# Patient Record
Sex: Male | Born: 1950
Health system: Southern US, Community
[De-identification: ages and names within clinical notes are randomized; demographics above are authoritative.]

## PROBLEM LIST (undated history)

## (undated) DIAGNOSIS — Z87442 Personal history of urinary calculi: Secondary | ICD-10-CM

## (undated) HISTORY — PX: OTHER SURGICAL HISTORY: SHX169

## (undated) HISTORY — PX: TONSILLECTOMY: SUR1361

---

## 1998-11-06 ENCOUNTER — Ambulatory Visit: Admission: RE | Admit: 1998-11-06 | Discharge: 1998-11-06 | Payer: Self-pay | Admitting: Internal Medicine

## 2007-01-11 ENCOUNTER — Encounter: Admission: RE | Admit: 2007-01-11 | Discharge: 2007-01-11 | Payer: Self-pay | Admitting: Geriatric Medicine

## 2010-10-07 ENCOUNTER — Emergency Department (HOSPITAL_COMMUNITY): Payer: BC Managed Care – PPO

## 2010-10-07 ENCOUNTER — Emergency Department (HOSPITAL_COMMUNITY)
Admission: EM | Admit: 2010-10-07 | Discharge: 2010-10-07 | Disposition: A | Discharge status: Home / Self Care | Payer: BC Managed Care – PPO | Attending: Emergency Medicine | Admitting: Emergency Medicine

## 2010-10-07 DIAGNOSIS — E7800 Pure hypercholesterolemia, unspecified: Secondary | ICD-10-CM | POA: Insufficient documentation

## 2010-10-07 DIAGNOSIS — G589 Mononeuropathy, unspecified: Secondary | ICD-10-CM | POA: Insufficient documentation

## 2010-10-07 DIAGNOSIS — IMO0002 Reserved for concepts with insufficient information to code with codable children: Secondary | ICD-10-CM | POA: Insufficient documentation

## 2010-10-07 DIAGNOSIS — N201 Calculus of ureter: Secondary | ICD-10-CM | POA: Insufficient documentation

## 2010-10-07 DIAGNOSIS — R109 Unspecified abdominal pain: Secondary | ICD-10-CM | POA: Insufficient documentation

## 2010-10-07 LAB — URINE MICROSCOPIC-ADD ON

## 2010-10-07 LAB — URINALYSIS, ROUTINE W REFLEX MICROSCOPIC
Bilirubin Urine: NEGATIVE
Nitrite: NEGATIVE
Specific Gravity, Urine: 1.018 (ref 1.005–1.030)
pH: 7 (ref 5.0–8.0)

## 2010-10-07 LAB — GLUCOSE, CAPILLARY: Glucose-Capillary: 132 mg/dL — ABNORMAL HIGH (ref 70–99)

## 2011-02-06 ENCOUNTER — Other Ambulatory Visit: Payer: Self-pay | Admitting: Internal Medicine

## 2011-02-06 NOTE — Progress Notes (Signed)
Failed attempt to print a script. Derrick Donaldson

## 2014-03-19 ENCOUNTER — Ambulatory Visit
Admission: RE | Admit: 2014-03-19 | Discharge: 2014-03-19 | Disposition: A | Discharge status: Home / Self Care | Payer: BC Managed Care – PPO | Source: Ambulatory Visit | Attending: Geriatric Medicine | Admitting: Geriatric Medicine

## 2014-03-19 ENCOUNTER — Other Ambulatory Visit: Payer: Self-pay | Admitting: Geriatric Medicine

## 2014-03-19 DIAGNOSIS — R059 Cough, unspecified: Secondary | ICD-10-CM

## 2014-03-19 DIAGNOSIS — R05 Cough: Secondary | ICD-10-CM

## 2014-07-17 ENCOUNTER — Other Ambulatory Visit: Payer: Self-pay | Admitting: Gastroenterology

## 2014-09-09 ENCOUNTER — Other Ambulatory Visit: Payer: Self-pay | Admitting: Gastroenterology

## 2014-09-22 ENCOUNTER — Encounter (HOSPITAL_COMMUNITY): Payer: Self-pay | Admitting: *Deleted

## 2014-09-23 ENCOUNTER — Ambulatory Visit (HOSPITAL_COMMUNITY): Payer: BC Managed Care – PPO | Admitting: Anesthesiology

## 2014-09-23 ENCOUNTER — Encounter (HOSPITAL_COMMUNITY)
Admission: RE | Disposition: A | Discharge status: Home / Self Care | Payer: Self-pay | Source: Ambulatory Visit | Attending: Gastroenterology

## 2014-09-23 ENCOUNTER — Encounter (HOSPITAL_COMMUNITY): Payer: Self-pay

## 2014-09-23 ENCOUNTER — Ambulatory Visit (HOSPITAL_COMMUNITY)
Admission: RE | Admit: 2014-09-23 | Discharge: 2014-09-23 | Disposition: A | Discharge status: Home / Self Care | Payer: BC Managed Care – PPO | Source: Ambulatory Visit | Attending: Gastroenterology | Admitting: Gastroenterology

## 2014-09-23 DIAGNOSIS — Z87442 Personal history of urinary calculi: Secondary | ICD-10-CM | POA: Insufficient documentation

## 2014-09-23 DIAGNOSIS — Z1211 Encounter for screening for malignant neoplasm of colon: Secondary | ICD-10-CM | POA: Insufficient documentation

## 2014-09-23 DIAGNOSIS — G629 Polyneuropathy, unspecified: Secondary | ICD-10-CM | POA: Diagnosis not present

## 2014-09-23 DIAGNOSIS — G2581 Restless legs syndrome: Secondary | ICD-10-CM | POA: Insufficient documentation

## 2014-09-23 DIAGNOSIS — E780 Pure hypercholesterolemia: Secondary | ICD-10-CM | POA: Diagnosis not present

## 2014-09-23 DIAGNOSIS — D124 Benign neoplasm of descending colon: Secondary | ICD-10-CM | POA: Insufficient documentation

## 2014-09-23 HISTORY — DX: Personal history of urinary calculi: Z87.442

## 2014-09-23 HISTORY — PX: COLONOSCOPY WITH PROPOFOL: SHX5780

## 2014-09-23 SURGERY — COLONOSCOPY WITH PROPOFOL
Anesthesia: Monitor Anesthesia Care

## 2014-09-23 MED ORDER — PROPOFOL 10 MG/ML IV BOLUS
INTRAVENOUS | Status: AC
  Filled 2014-09-23: qty 20

## 2014-09-23 MED ORDER — PROPOFOL 10 MG/ML IV BOLUS
INTRAVENOUS | Status: DC | PRN
  Administered 2014-09-23 (×2): 75 mg via INTRAVENOUS
  Administered 2014-09-23: 50 mg via INTRAVENOUS
  Administered 2014-09-23: 25 mg via INTRAVENOUS
  Administered 2014-09-23: 50 mg via INTRAVENOUS

## 2014-09-23 MED ORDER — SODIUM CHLORIDE 0.9 % IV SOLN: INTRAVENOUS | Status: DC

## 2014-09-23 MED ORDER — LACTATED RINGERS IV SOLN
INTRAVENOUS | Status: DC | PRN
  Administered 2014-09-23: 11:00:00 via INTRAVENOUS

## 2014-09-23 SURGICAL SUPPLY — 21 items

## 2014-09-23 NOTE — H&P (Signed)
  Procedure: Baseline screening colonoscopy  History: The patient is a 64 year old male born [birth year]. He is scheduled to undergo his first screening colonoscopy with polypectomy to prevent colon cancer.  Medication allergies: Lipitor and Crestor caused muscle pain. Pravastatin caused memory loss  Past medical history: Left arthroscopic knee surgery. Kidney stone. Peripheral neuropathy. Lumbar disc disease. Hypercholesterolemia. Nasal polyps. Restless leg syndrome.  Exam: The patient is alert and lying comfortably on the endoscopy stretcher. Abdomen is soft and nontender to palpation. Cardiac exam reveals a regular rhythm. Lungs are clear to auscultation.  Plan: Proceed with baseline screening colonoscopy

## 2014-09-23 NOTE — Op Note (Signed)
Procedure: Baseline screening colonoscopy  Endoscopist: Earle Gell  Premedication: Propofol administered by anesthesia  Procedure: The patient was placed in left lateral decubitus position. Anal inspection and digital rectal exam were normal. The Pentax pediatric colonoscope was introduced into the rectum and advanced to the cecum. A normal-appearing appendiceal orifice was identified. A normal-appearing ileocecal valve was identified. Colonic preparation for the exam today was excellent. Withdrawal time was 11 minutes  Rectum. Normal. Retroflexed view of the distal rectum normal  Sigmoid colon. Normal  Descending colon. From the proximal descending colon, a 2 mm sessile polyp was removed with the cold biopsy forceps  Splenic flexure. Normal  Transverse colon. Normal  Hepatic flexure. Normal  Ascending colon. Normal  Cecum and ileocecal valve. Normal  Assessment: A diminutive polyp was removed from the descending colon, otherwise normal colonoscopy  Recommendation: If the diminutive descending colon polyp returns adenomatous pathologically, the patient should undergo a surveillance colonoscopy in 5 years. If the polyp returns nonneoplastic pathologically, he should undergo a repeat screening colonoscopy in 10 years

## 2014-09-23 NOTE — Transfer of Care (Signed)
Immediate Anesthesia Transfer of Care Note  Patient: Derrick Donaldson  Procedure(s) Performed: Procedure(s): COLONOSCOPY WITH PROPOFOL (N/A)  Patient Location: PACU  Anesthesia Type:MAC  Level of Consciousness: awake, sedated and patient cooperative  Airway & Oxygen Therapy: Patient Spontanous Breathing and Patient connected to face mask oxygen  Post-op Assessment: Report given to RN and Post -op Vital signs reviewed and stable  Post vital signs: Reviewed and stable  Last Vitals:  Filed Vitals:   09/23/14 1036  BP: 133/92  Temp: 36.4 C  Resp: 19    Complications: No apparent anesthesia complications

## 2014-09-23 NOTE — Anesthesia Preprocedure Evaluation (Signed)
Anesthesia Evaluation  Patient identified by MRN, date of birth, ID band Patient awake    History of Anesthesia Complications Negative for: history of anesthetic complications  Airway Mallampati: I   Neck ROM: Full    Dental  (+) Teeth Intact   Pulmonary neg pulmonary ROS,  breath sounds clear to auscultation        Cardiovascular negative cardio ROS  Rhythm:Regular Rate:Normal     Neuro/Psych negative neurological ROS     GI/Hepatic negative GI ROS, Neg liver ROS,   Endo/Other  negative endocrine ROS  Renal/GU negative Renal ROS     Musculoskeletal   Abdominal   Peds  Hematology negative hematology ROS (+)   Anesthesia Other Findings   Reproductive/Obstetrics                             Anesthesia Physical Anesthesia Plan  ASA: I  Anesthesia Plan: MAC   Post-op Pain Management:    Induction:   Airway Management Planned: Natural Airway  Additional Equipment:   Intra-op Plan:   Post-operative Plan:   Informed Consent: I have reviewed the patients History and Physical, chart, labs and discussed the procedure including the risks, benefits and alternatives for the proposed anesthesia with the patient or authorized representative who has indicated his/her understanding and acceptance.   Dental advisory given  Plan Discussed with: Surgeon  Anesthesia Plan Comments:         Anesthesia Quick Evaluation

## 2014-09-23 NOTE — Discharge Instructions (Signed)

## 2014-09-24 ENCOUNTER — Encounter (HOSPITAL_COMMUNITY): Payer: Self-pay | Admitting: Gastroenterology

## 2014-09-24 NOTE — Anesthesia Postprocedure Evaluation (Signed)
  Anesthesia Post-op Note  Patient: Derrick Donaldson  Procedure(s) Performed: Procedure(s): COLONOSCOPY WITH PROPOFOL (N/A)  Patient Location: Endoscopy Unit  Anesthesia Type:MAC  Level of Consciousness: awake and alert   Airway and Oxygen Therapy: Patient Spontanous Breathing  Post-op Pain: none  Post-op Assessment: Post-op Vital signs reviewed  Post-op Vital Signs: stable  Last Vitals:  Filed Vitals:   09/23/14 1200  BP: 135/83  Pulse: 61  Temp:   Resp: 22    Complications: No apparent anesthesia complications

## 2016-02-07 IMAGING — CR DG CHEST 2V
2 series · 2 of 2 positions shown · non-contrast
Comparison: CT Abdomen and Pelvis 10/07/2010.

CLINICAL DATA: 63-year-old male with persistent nonproductive cough
for 5 weeks. Initial encounter.

EXAM:
CHEST  2 VIEW

[w chest pa]
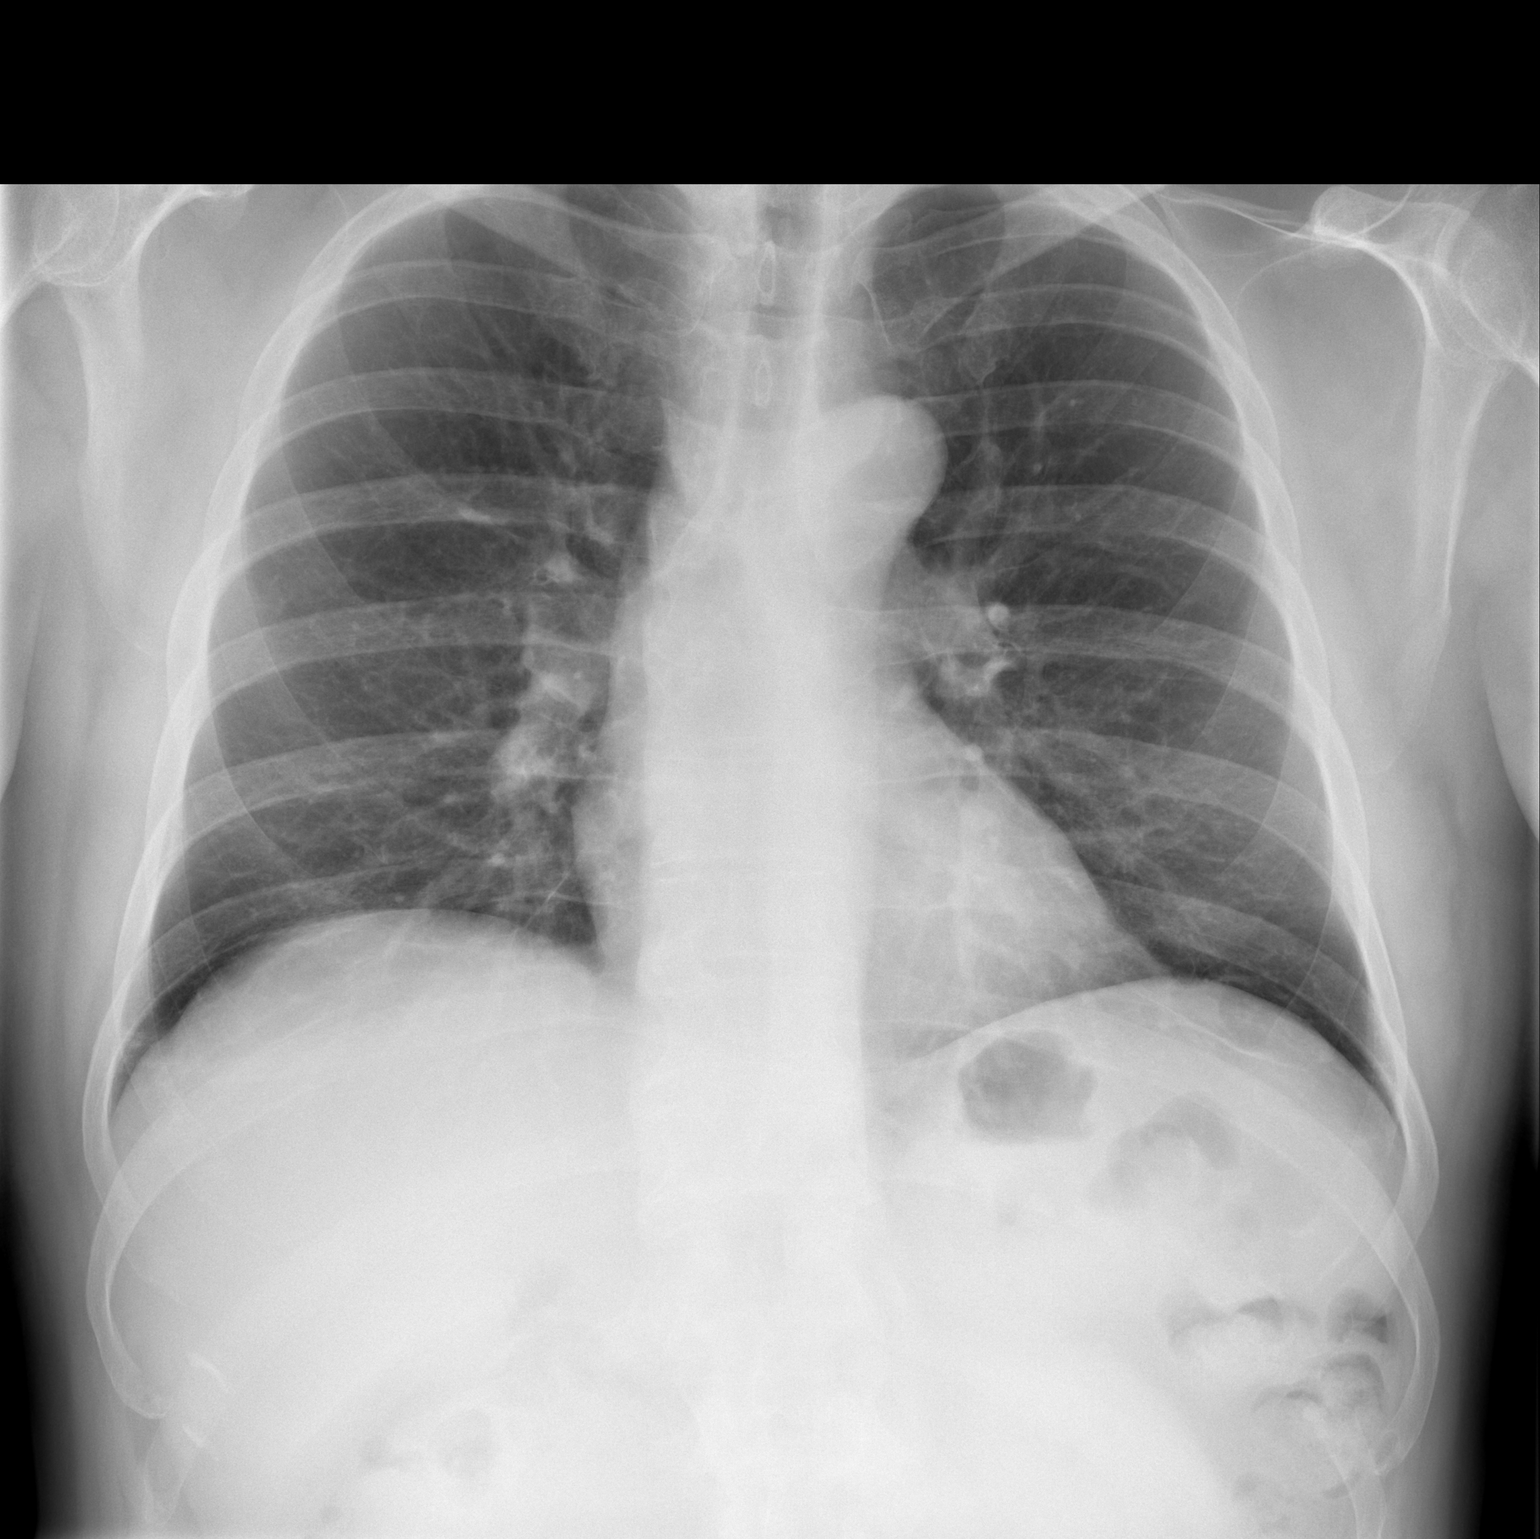

[w chest lat]
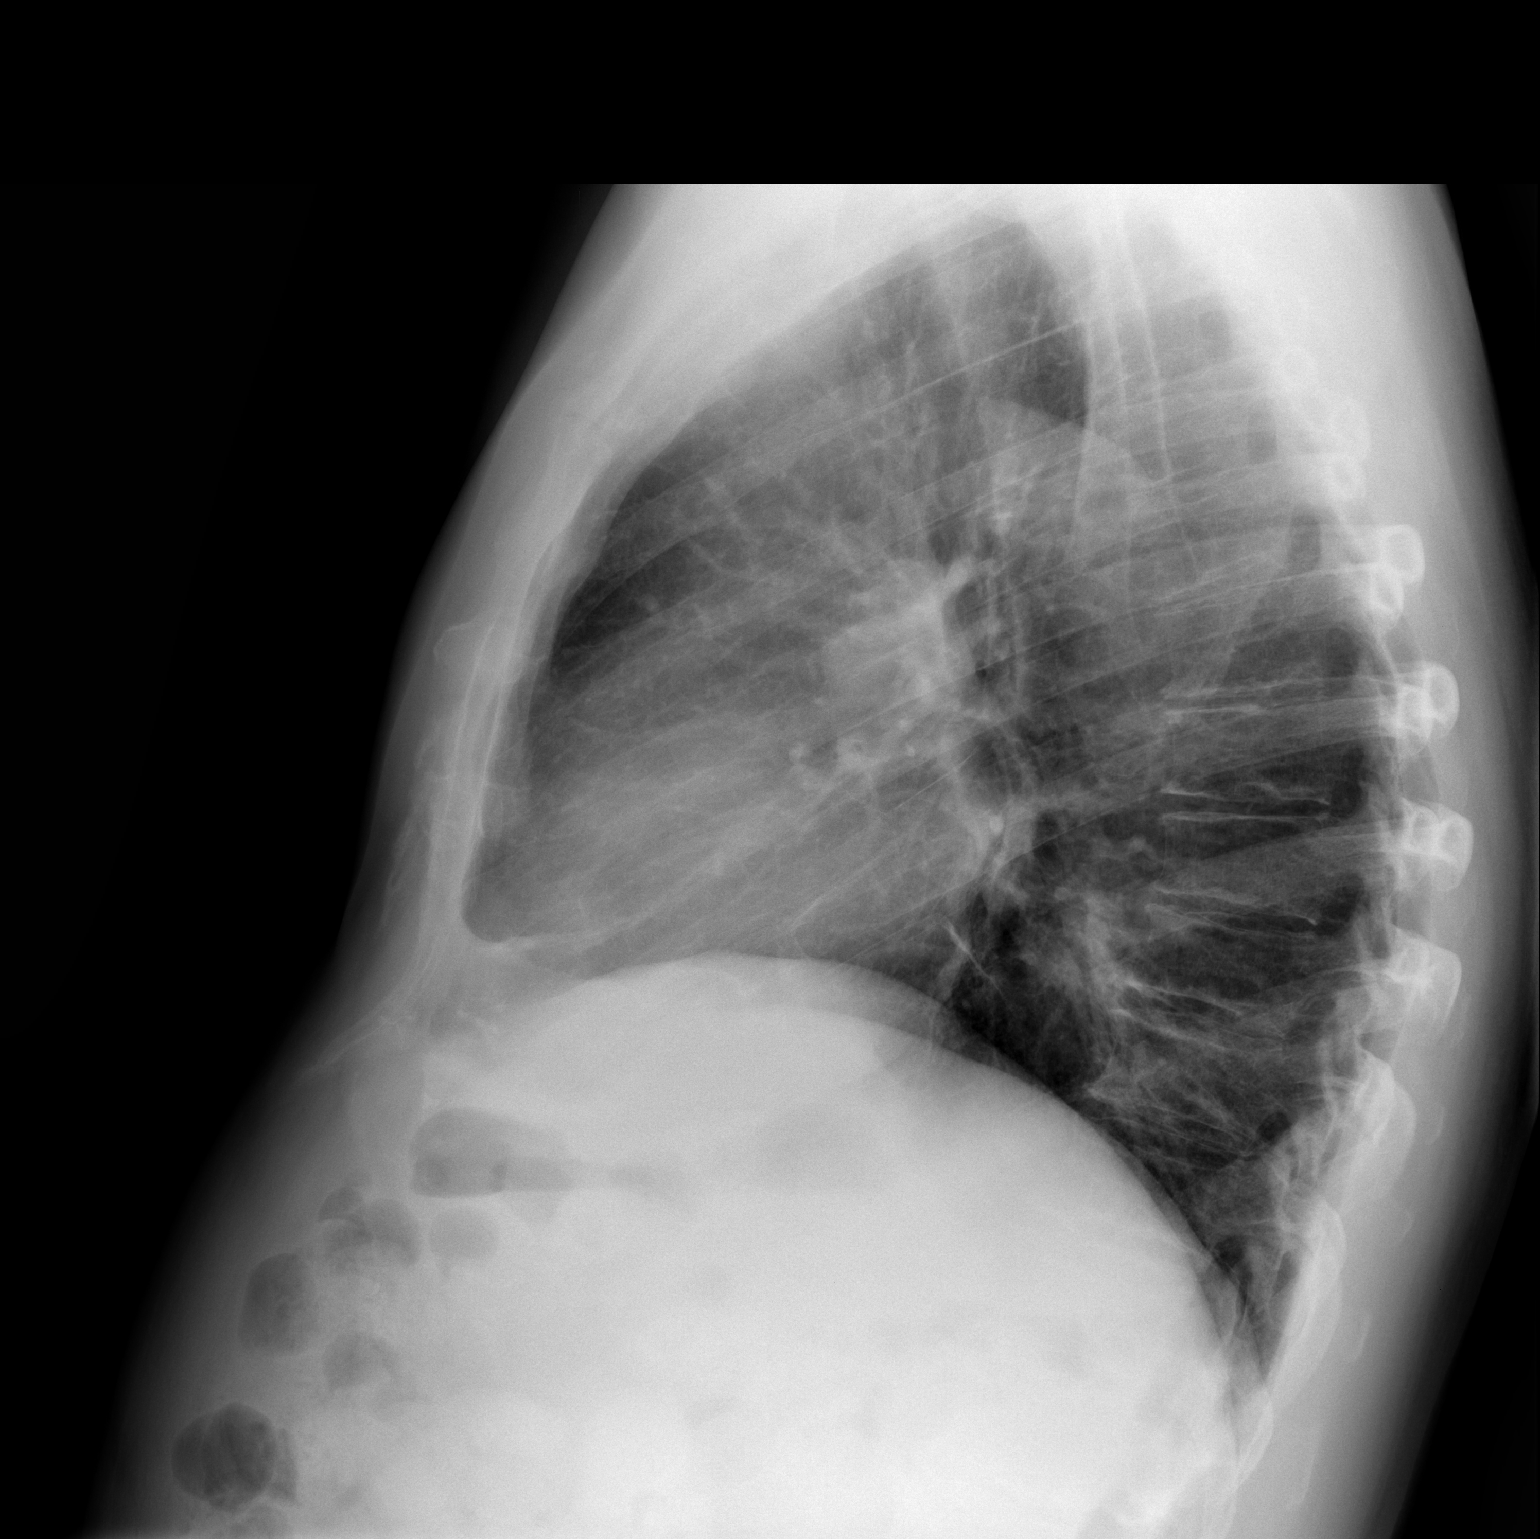

[2 of 2 positions shown; findings below may reference images not displayed]

FINDINGS: Normal cardiac size and mediastinal contours. Visualized tracheal
air column is within normal limits. Lung volumes are within normal
limits. No pneumothorax or pulmonary edema. No pleural effusion or
consolidation. No confluent pulmonary opacity. Mild scoliosis and
multilevel degenerative changes in the spine. No acute osseous
abnormality identified.
IMPRESSION: No acute cardiopulmonary abnormality.

## 2017-03-31 DIAGNOSIS — Z79899 Other long term (current) drug therapy: Secondary | ICD-10-CM | POA: Diagnosis not present

## 2017-03-31 DIAGNOSIS — H905 Unspecified sensorineural hearing loss: Secondary | ICD-10-CM | POA: Diagnosis not present

## 2017-03-31 DIAGNOSIS — G629 Polyneuropathy, unspecified: Secondary | ICD-10-CM | POA: Diagnosis not present

## 2017-03-31 DIAGNOSIS — E7800 Pure hypercholesterolemia, unspecified: Secondary | ICD-10-CM | POA: Diagnosis not present

## 2017-03-31 DIAGNOSIS — Z6829 Body mass index (BMI) 29.0-29.9, adult: Secondary | ICD-10-CM | POA: Diagnosis not present

## 2017-03-31 DIAGNOSIS — E663 Overweight: Secondary | ICD-10-CM | POA: Diagnosis not present

## 2017-03-31 DIAGNOSIS — Z23 Encounter for immunization: Secondary | ICD-10-CM | POA: Diagnosis not present

## 2017-03-31 DIAGNOSIS — Z0000 Encounter for general adult medical examination without abnormal findings: Secondary | ICD-10-CM | POA: Diagnosis not present

## 2017-03-31 DIAGNOSIS — R269 Unspecified abnormalities of gait and mobility: Secondary | ICD-10-CM | POA: Diagnosis not present

## 2017-04-04 DIAGNOSIS — E785 Hyperlipidemia, unspecified: Secondary | ICD-10-CM | POA: Diagnosis not present

## 2017-04-04 DIAGNOSIS — R972 Elevated prostate specific antigen [PSA]: Secondary | ICD-10-CM | POA: Diagnosis not present

## 2017-04-04 DIAGNOSIS — Z79899 Other long term (current) drug therapy: Secondary | ICD-10-CM | POA: Diagnosis not present

## 2017-04-04 DIAGNOSIS — Z0000 Encounter for general adult medical examination without abnormal findings: Secondary | ICD-10-CM | POA: Diagnosis not present

## 2017-04-11 ENCOUNTER — Ambulatory Visit: Payer: Medicare Other | Attending: Geriatric Medicine | Admitting: Physical Therapy

## 2017-04-11 DIAGNOSIS — R208 Other disturbances of skin sensation: Secondary | ICD-10-CM | POA: Diagnosis not present

## 2017-04-11 DIAGNOSIS — R2689 Other abnormalities of gait and mobility: Secondary | ICD-10-CM | POA: Diagnosis not present

## 2017-04-11 DIAGNOSIS — M6281 Muscle weakness (generalized): Secondary | ICD-10-CM

## 2017-04-11 DIAGNOSIS — R29818 Other symptoms and signs involving the nervous system: Secondary | ICD-10-CM | POA: Diagnosis not present

## 2017-04-12 ENCOUNTER — Encounter: Payer: Self-pay | Admitting: Physical Therapy

## 2017-04-12 NOTE — Therapy (Addendum)
[facility] [address] [place], [place], [address] Phone: [phone]   Fax:  [phone]  Physical Therapy Evaluation  Patient Details  Name: Derrick Donaldson MRN: [id number] Date of Birth: May 12, 1951 Referring Provider: Lajean Manes MD  Encounter Date: 04/11/2017      PT End of Session - 04/12/17 2238    Visit Number 1   Number of Visits 17   Date for PT Re-Evaluation 05/11/17   Authorization Type BCBS   PT Start Time 1102   PT Stop Time 1145   PT Time Calculation (min) 43 min   Activity Tolerance Patient tolerated treatment well   Behavior During Therapy [facility] for tasks assessed/performed      Past Medical History:  Diagnosis Date  . History of kidney stones     Past Surgical History:  Procedure Laterality Date  . COLONOSCOPY WITH PROPOFOL N/A 09/23/2014   Procedure: COLONOSCOPY WITH PROPOFOL;  Surgeon: [name], MD;  Location: [facility];  Service: Endoscopy;  Laterality: N/A;  . knee arthroscopic surgery    . TONSILLECTOMY      There were no vitals filed for this visit.       Subjective Assessment - 04/13/17 1226    Subjective I had an incident a few weeks ago (tripped going up steps while passing back quizzes). No injury. Did also fall in the parking lot and tripped and fell for no particular reason. Has had significant stumbles also.    Pertinent History Peripheral neuropathy. Lumbar disc disease. Restless leg syndrome, hearing loss R ear   Limitations Standing;Walking   How long can you stand comfortably? 15 minutes   How long can you walk comfortably? 30 minutes   Patient Stated Goals better balance; easier getting out of the chair; less falls and stumbling; better balance when riding bike (and stopping with clipss)          04/12/17 0001  Assessment  Medical Diagnosis gait disorder  Referring Provider Lajean Manes MD  Onset Date/Surgical Date (balance slowly over past  couple years)  Prior Therapy none  Precautions  Precautions Fall  Restrictions  Weight Bearing Restrictions No  Balance Screen  Has the patient fallen in the past 6 months Yes  How many times? 2  Has the patient had a decrease in activity level because of a fear of falling?  Yes  Is the patient reluctant to leave their home because of a fear of falling?  No  Home Environment  Living Environment Private residence  Living Arrangements Spouse/significant other;Children (37 yo)  Type of [place] to enter  Entrance Stairs-Number of Steps 2  Entrance Stairs-Rails None (in front has 1 rail and more steps)  Home Layout Two level;Able to live on main level with bedroom/bathroom;Full bath on main level  Alternate Level Stairs-Number of Steps 14  Alternate Level Stairs-Rails Right  Home Equipment None  Prior Function  Level of Independence Independent  Vocation Part time employment (phased retirement)  Biomedical scientist professor, [employer]olic[employer]  Leisure cycling;   Cognition  Overall Cognitive Status Within Functional Limits for tasks assessed  Observation/Other Assessments  Focus on Therapeutic Outcomes (FOTO)  FS 69 risk adjusted 60  Activities of Balance Confidence Scale (ABC Scale)  87.5%  Sensation  Light Touch Impaired Detail (feet)  Proprioception Appears Intact  Coordination  Gross Motor Movements are Fluid and Coordinated Yes  Fine Motor Movements are Fluid and Coordinated Yes  ROM / Strength  AROM / PROM / Strength AROM;Strength  AROM  Overall AROM  Within functional limits for tasks performed  Strength  Overall Strength Within functional limits for tasks performed  Transfers  Transfers Sit to Stand;Stand to Sit  Sit to Stand 6: Modified independent (Device/Increase time);With upper extremity assist  Stand to Sit 6: Modified independent (Device/Increase time);With upper extremity assist  Ambulation/Gait  Ambulation/Gait Yes   Ambulation/Gait Assistance 6: Modified independent (Device/Increase time)  Ambulation Distance (Feet) 100 Feet (x2, 60)  Assistive device None  Gait Pattern Step-through pattern;Decreased arm swing - right;Decreased arm swing - left;Lateral hip instability  Ambulation Surface Level  Gait velocity 32.8/9.0=3.64 ft/sec normal  (32.8/7.04=4.66 ft/sec)  Standardized Balance Assessment  Standardized Balance Assessment Berg Balance Test  Berg Balance Test  Sit to Stand 3  Standing Unsupported 4  Sitting with Back Unsupported but Feet Supported on Floor or Stool 4  Stand to Sit 3  Transfers 3  Standing Unsupported with Eyes Closed 4  Standing Ubsupported with Feet Together 3  From Standing, Reach Forward with Outstretched Arm 4  From Standing Position, Pick up Object from Floor 4  From Standing Position, Turn to Look Behind Over each Shoulder 2  Turn 360 Degrees 4  Standing Unsupported, Alternately Place Feet on Step/Stool 0  Standing Unsupported, One Foot in Front 0  Standing on One Leg 1  Total Score 39  Functional Gait  Assessment  Gait assessed  Yes  Gait Level Surface 2  Change in Gait Speed 3  Gait with Horizontal Head Turns 2  Gait with Vertical Head Turns 2  Gait and Pivot Turn 3  Step Over Obstacle 3  Gait with Narrow Base of Support 0  Gait with Eyes Closed 2  Ambulating Backwards 3  Steps 2  Total Score 22           Objective measurements completed on examination: See above findings.                  PT Education - 04/12/17 2236    Education provided Yes   Education Details results of eval and PT POC; 3 parts of balance system and plan to do SOT next visit; strategies to increase safety due to neuropathy (avoid walking in dark, using hand on surface to improve somatosensory input)   Person(s) Educated Patient   Methods Explanation   Comprehension Verbalized understanding          PT Short Term Goals - 04/13/17 1056      PT SHORT TERM  GOAL #1   Title Patient will be independent with basic HEP to address balance deficits. (Target date all STGs: 05/11/17)   Time 4   Period Weeks   Status New   Target Date 05/11/17     PT SHORT TERM GOAL #2   Title Patient will improve FGA score to >24/30 to indicate lesser fall risk and improved balance..   Baseline 22/30   Time 4   Period Weeks   Status New     PT SHORT TERM GOAL #3   Title Patient will improve Berg Balance score to >=45/56 to indicate lesser fall risk and improved balance.   Baseline 39/56   Time 4   Period Weeks   Status New     PT SHORT TERM GOAL #4   Title Patient will verbalize understanding of fall prevention strategies for his home environment.    Time 1   Period Weeks   Status New  PT SHORT TERM GOAL #5   Title Complete SOT via Balancemaster and set STG v LTG as appropriate.    Time 1   Period Weeks   Status New           PT Long Term Goals - 04/13/17 1102      PT LONG TERM GOAL #1   Title Patient will be independent with updated HEP for balance (Target all LTGs 06/10/17)   Time 8   Period Weeks   Status New   Target Date 06/10/17     PT LONG TERM GOAL #2   Title Patient will improve FGA score to >=27/30 to indicate lesser fall risk and improved balance.    Time 8   Period Weeks   Status New     PT LONG TERM GOAL #3   Title Patient will improve Berg score to >= 50/56 to indicate lesser fall risk and improved balance.   Time 8   Period Weeks   Status New     PT LONG TERM GOAL #4   Title Patient will verbalize a plan for continued community based activity upon discharge from PT   Time [place] - 04/12/17 2242    Clinical Impression Statement Patient presents for PT evaluation s/p 2 falls in 6 months. Standardized tests indicate he is a higher fall risk Merrilee Jansky [phone] with <45 indicative of higher risk; FGA 22/30 with <=22 indicative). Patient can benefit from PT to educate him  on strategies to compensate for sensory loss, improve his strategies to help him maintain his balance, and strategies to help him recover his balance when he does experience imbalance. Patient will benefit from the PT interventions listed below to address the additional deficits outlined below.    History and Personal Factors relevant to plan of care: PMH-Peripheral neuropathy. Lumbar disc disease. Restless leg syndrome, hearing loss R ear;  Personal factors-mechanism of injury (sensory loss/neuropathy)   Clinical Presentation Evolving   Clinical Presentation due to: progressive nature of neuropathy and declining balance with 2 falls already   Clinical Decision Making Moderate   Rehab Potential Good   Clinical Impairments Affecting Rehab Potential neuropathy bil LEs   PT Frequency 2x / week   PT Duration 8 weeks   PT Treatment/Interventions ADLs/Self Care Home Management;Functional mobility training;Stair training;Gait training;DME Instruction;Therapeutic activities;Therapeutic exercise;Balance training;Neuromuscular re-education;Patient/family education;Vestibular;Visual/perceptual remediation/compensation   PT Next Visit Plan do SOT and begin HEP   Consulted and Agree with Plan of Care Patient      Patient will benefit from skilled therapeutic intervention in order to improve the following deficits and impairments:  Decreased balance, Decreased knowledge of use of DME, Difficulty walking, Impaired perceived functional ability, Impaired sensation, Postural dysfunction  Visit Diagnosis: Other disturbances of skin sensation - Plan: PT plan of care cert/re-cert  Other abnormalities of gait and mobility - Plan: PT plan of care cert/re-cert  Muscle weakness (generalized) - Plan: PT plan of care cert/re-cert  Other symptoms and signs involving the nervous system - Plan: PT plan of care cert/re-cert     Problem List There are no active problems to display for this patient.     Rexanne Mano, PT 04/13/2017, 12:26 PM  [facility] [address] [place], [place], [address] Phone: [phone]   Fax:  [phone]  Name: JOSEMIGUEL GRIES  MRN: [id number] Date of Birth: Dec 05, 1950

## 2017-04-13 ENCOUNTER — Ambulatory Visit: Payer: Medicare Other | Admitting: Physical Therapy

## 2017-04-13 ENCOUNTER — Encounter: Payer: Self-pay | Admitting: Physical Therapy

## 2017-04-13 DIAGNOSIS — R2689 Other abnormalities of gait and mobility: Secondary | ICD-10-CM | POA: Diagnosis not present

## 2017-04-13 DIAGNOSIS — M6281 Muscle weakness (generalized): Secondary | ICD-10-CM | POA: Diagnosis not present

## 2017-04-13 DIAGNOSIS — R29818 Other symptoms and signs involving the nervous system: Secondary | ICD-10-CM | POA: Diagnosis not present

## 2017-04-13 DIAGNOSIS — R208 Other disturbances of skin sensation: Secondary | ICD-10-CM

## 2017-04-13 NOTE — Addendum Note (Signed)
Addended by: Rexanne Mano on: 04/13/2017 12:30 PM   Modules accepted: Orders

## 2017-04-13 NOTE — Patient Instructions (Signed)
     Be sure to do exercises in a corner with a chair back in front of you for safety!!     Feet Apart, Head Motion - Eyes Open    With eyes open, feet apart, move head slowly: UP to the left and DOWN to the right (diagonally) x 10 reps  Do 1 sessions per day.  Copyright  VHI. All rights reserved.    Feet Together, Head Motion - Eyes Open    With eyes open, feet together, move head slowly: up and down x 10, the left and right x 10   Do 1 sessions per day.  Copyright  VHI. All rights reserved.     Feet Apart, Head Motion - Eyes Closed   With eyes closed and feet shoulder width apart, hold for 30 seconds. If you don't make 30 seconds try a total of 3 times.  Do 1 sessions per day.

## 2017-04-13 NOTE — Therapy (Signed)
[facility] [address] [place], [place], [address] Phone: [phone]   Fax:  [phone]  Physical Therapy Treatment  Patient Details  Name: Derrick Donaldson MRN: [id number] Date of Birth: 11/04/1950 Referring Provider: Lajean Manes MD  Encounter Date: 04/13/2017      PT End of Session - 04/13/17 1959    Visit Number 2   Number of Visits 17   Date for PT Re-Evaluation 05/11/17   Authorization Type BCBS   PT Start Time 0845   PT Stop Time 0930   PT Time Calculation (min) 45 min   Activity Tolerance Patient tolerated treatment well   Behavior During Therapy [facility] for tasks assessed/performed      Past Medical History:  Diagnosis Date  . History of kidney stones     Past Surgical History:  Procedure Laterality Date  . COLONOSCOPY WITH PROPOFOL N/A 09/23/2014   Procedure: COLONOSCOPY WITH PROPOFOL;  Surgeon: [name], MD;  Location: [facility];  Service: Endoscopy;  Laterality: N/A;  . knee arthroscopic surgery    . TONSILLECTOMY      There were no vitals filed for this visit.      Subjective Assessment - 04/13/17 1238    Subjective No falls or near falls.    Pertinent History Peripheral neuropathy. Lumbar disc disease. Restless leg syndrome, hearing loss R ear   Limitations Standing;Walking   How long can you stand comfortably? 15 minutes   How long can you walk comfortably? 30 minutes   Patient Stated Goals better balance; easier getting out of the chair; less falls and stumbling; better balance when riding bike (and stopping with clipss)                         [facility] Adult PT Treatment/Exercise - 04/13/17 0001      Standardized Balance Assessment   Standardized Balance Assessment Balance Master Testing      Sensory Organization Testing=63% compared to age/height normative value of 70%.     Patient demonstrated scores of:   96 for use of somatosensory feedback for  balance (compared to age/height normative value of 94),  95 for use of visual feedback for balance (compared to age/height normative value of 82),   73 for use of vestibular feedback for balance (compared to age/height normative value of 54).   COG readings were forward and slightly right of center.           Balance Exercises - 04/13/17 1947      Balance Exercises: Standing   Standing Eyes Opened Narrow base of support (BOS);Wide (BOA);Head turns;Solid surface   Standing Eyes Closed Narrow base of support (BOS);Wide (BOA);Solid surface           PT Education - 04/13/17 1954    Education provided Yes   Education Details results of SOT; see HEP   Person(s) Educated Patient   Methods Explanation;Demonstration;Verbal cues;Handout   Comprehension Verbalized understanding;Returned demonstration;Verbal cues required;Need further instruction          PT Short Term Goals - 04/13/17 2109      PT SHORT TERM GOAL #1   Title Patient will be independent with basic HEP to address balance deficits. (Target date all STGs: 05/11/17)   Time 4   Period Weeks   Status New     PT SHORT TERM GOAL #2   Title Patient will improve FGA score to >24/30 to indicate lesser fall risk and improved  balance..   Baseline 22/30   Time 4   Period Weeks   Status New     PT SHORT TERM GOAL #3   Title Patient will improve Berg Balance score to >=45/56 to indicate lesser fall risk and improved balance.   Baseline 39/56   Time 4   Period Weeks   Status New     PT SHORT TERM GOAL #4   Title Patient will verbalize understanding of fall prevention strategies for his home environment.    Time 1   Period Weeks   Status New     PT SHORT TERM GOAL #5   Title Complete SOT via Balancemaster and set STG v LTG as appropriate.    Time 1   Period Weeks   Status Achieved     Additional Short Term Goals   Additional Short Term Goals Yes     PT SHORT TERM GOAL #6   Title Patient will not "fall" on at  least one trial out of three for condition 6 on SOT   Baseline fell on 3 of 3 trials   Time 4   Period Weeks   Status New           PT Long Term Goals - 04/13/17 2121      PT LONG TERM GOAL #1   Title Patient will be independent with updated HEP for balance (Target all LTGs 06/10/17)   Time 8   Period Weeks   Status New     PT LONG TERM GOAL #2   Title Patient will improve FGA score to >=27/30 to indicate lesser fall risk and improved balance.    Time 8   Period Weeks   Status New     PT LONG TERM GOAL #3   Title Patient will improve Berg score to >= 50/56 to indicate lesser fall risk and improved balance.   Time 8   Period Weeks   Status New     PT LONG TERM GOAL #4   Title Patient will verbalize a plan for continued community based activity upon discharge from PT   Time 8   Period Weeks   Status New     PT LONG TERM GOAL #5   Title Patient will improve his composite score to >=70 on SOT   Time 8   Period Weeks   Status New               Plan - 04/13/17 2000    Clinical Impression Statement Session focused on assessing balance systems via Balance Master using SOT and initiating a HEP. Overall his composite score of 63 was below the norm for his age (score of 32). He "fell"/failed on each of the trials for condition 6 (inaccurate visual and somatosensory information, therefore relying more on the vestibular system). Patient has reported recent decreased hearing in his right ear of unknown etiology. Feel patient could potentially benefit from an ENT consult. Will continue with PT as his fall risk remains high.    Rehab Potential Good   Clinical Impairments Affecting Rehab Potential neuropathy bil LEs   PT Frequency 2x / week   PT Duration 8 weeks   PT Treatment/Interventions ADLs/Self Care Home Management;Functional mobility training;Stair training;Gait training;DME Instruction;Therapeutic activities;Therapeutic exercise;Balance training;Neuromuscular  re-education;Patient/family education;Vestibular;Visual/perceptual remediation/compensation   PT Next Visit Plan review HEP; check if his PCP has made a referral for ENT (?recommend [facility]). Add to HEP (tandem walk, backwards, etc)   Consulted and Agree with Plan  of Care Patient      Patient will benefit from skilled therapeutic intervention in order to improve the following deficits and impairments:  Decreased balance, Decreased knowledge of use of DME, Difficulty walking, Impaired perceived functional ability, Impaired sensation, Postural dysfunction  Visit Diagnosis: Other disturbances of skin sensation  Other symptoms and signs involving the nervous system     Problem List There are no active problems to display for this patient.   Rexanne Mano, PT 04/13/2017, 9:28 PM  [facility] [address] [place], [place], [address] Phone: [phone]   Fax:  [phone]  Name: Derrick Donaldson MRN: [id number] Date of Birth: 01-14-1951

## 2017-04-20 ENCOUNTER — Telehealth: Payer: Self-pay | Admitting: Physical Therapy

## 2017-04-20 ENCOUNTER — Ambulatory Visit: Payer: Medicare Other | Admitting: Physical Therapy

## 2017-04-20 ENCOUNTER — Encounter: Payer: Self-pay | Admitting: Physical Therapy

## 2017-04-20 DIAGNOSIS — R2689 Other abnormalities of gait and mobility: Secondary | ICD-10-CM

## 2017-04-20 DIAGNOSIS — M6281 Muscle weakness (generalized): Secondary | ICD-10-CM

## 2017-04-20 DIAGNOSIS — R29818 Other symptoms and signs involving the nervous system: Secondary | ICD-10-CM | POA: Diagnosis not present

## 2017-04-20 DIAGNOSIS — R208 Other disturbances of skin sensation: Secondary | ICD-10-CM | POA: Diagnosis not present

## 2017-04-20 NOTE — Patient Instructions (Signed)
Feet Together (Compliant Surface) Head Motion - Eyes Closed    Stand on compliant surface:  with feet together. Close eyes and move head slowly, up and down x 10, left and right x 10, and each diagonal x 10. Do once per day.  Copyright  VHI. All rights reserved.    Feet Heel-Toe "Tandem", Head Motion - Eyes Open    With eyes open, right foot almost in front of the other (heel of front foot beyond the toes of back foot), hold for 30 seconds. (Do as many repetitions as it takes to get to a total of 30 sec). Repeat with each foot forward.   If able to hold for 30 sec, then move head slowly: up and down x 10, left and right x 10, and each diagonal x 10.  Do once per day.     Tandem Walking    Next to a counter with one hand lightly helping, walk with each foot directly in front of other, heel of one foot touching toes of other foot with each step. Both feet straight ahead. Repeat x 5 lengths of counter.    ANKLE: Plantarflexion - Sitting (Band)    Place band around foot; hold other ends in both or one hand. Sit at edge of sitting surface. Keep heel in place. Push foot down against band. Hold _3__ seconds. Use ___black_____ band. Repeat on other foot.  _20__ reps per set, __2_ sets per day, __5_ days per week  Copyright  VHI. All rights reserved.     ANKLE: Dorsiflexion (Band)    Sit at edge of surface. Place band around top of foot. Keeping heel on floor, raise toes of banded foot. Hold _3_ seconds. Use ____red____ band. __20_ reps per set, __2_ sets per day, __5_ days per week  Copyright  VHI. All rights reserved.

## 2017-04-20 NOTE — Telephone Encounter (Signed)
Dr. Felipa Eth,  Thank you for referring Derrick Donaldson for physical therapy. He mentioned a recent hearing loss in his right ear and that you had recommended he obtain a hearing aid through [employer].   During his evaluation, he showed decreased use of his vestibular system (Sensory Organization Testing 63% compared to age/height/sex normative value of 70%). His symptoms are indicative of a peripheral vestibular disorder. He was asking if he should go to an audiologist or an ENT physician. I told him I would forward this information to you and if you felt an ENT referral was appropriate, you could order that.   Thank you for your attention to Mr. Derrick Donaldson care,  Barry Brunner, PT [facility] [address] [place], [place] [address] [phone]

## 2017-04-20 NOTE — Therapy (Signed)
[facility] [address] [place], [place], [address] Phone: [phone]   Fax:  [phone]  Physical Therapy Treatment  Patient Details  Name: Derrick Donaldson MRN: [id number] Date of Birth: August 20, 1950 Referring Provider: Lajean Manes MD  Encounter Date: 04/20/2017      PT End of Session - 04/20/17 1055    Visit Number 3   Number of Visits 17   Date for PT Re-Evaluation 05/11/17   Authorization Type BCBS   PT Start Time 0810  delayed due to weather   PT Stop Time 0846   PT Time Calculation (min) 36 min   Activity Tolerance Patient tolerated treatment well   Behavior During Therapy [facility] for tasks assessed/performed      Past Medical History:  Diagnosis Date  . History of kidney stones     Past Surgical History:  Procedure Laterality Date  . COLONOSCOPY WITH PROPOFOL N/A 09/23/2014   Procedure: COLONOSCOPY WITH PROPOFOL;  Surgeon: [name], MD;  Location: [facility];  Service: Endoscopy;  Laterality: N/A;  . knee arthroscopic surgery    . TONSILLECTOMY      There were no vitals filed for this visit.      Subjective Assessment - 04/20/17 1048    Subjective No falls or near falls. Corner balance exercises have become easy.    Pertinent History Peripheral neuropathy. Lumbar disc disease. Restless leg syndrome, hearing loss R ear   Limitations Standing;Walking   How long can you stand comfortably? 15 minutes   How long can you walk comfortably? 30 minutes   Patient Stated Goals better balance; easier getting out of the chair; less falls and stumbling; better balance when riding bike (and stopping with clipss)   Currently in Pain? No/denies                              Balance Exercises - 04/20/17 1049      Balance Exercises: Standing   Standing Eyes Opened Narrow base of support (BOS);Wide (BOA);Head turns;Foam/compliant surface;Solid surface  feet together,  semi-tandem; horiz, vertical, diagonals   Standing Eyes Closed Narrow base of support (BOS);Wide (BOA);Head turns;Foam/compliant surface;Solid surface   Tandem Stance Eyes open;Upper extremity support 1;2 reps;10 secs   Tandem Gait Forward;Intermittent upper extremity support;5 reps   Retro Gait Upper extremity support;4 reps   Heel Raises Limitations unable in standing; progressed to sitting with 5# weights held on distal thighs x 20 reps; with black theraband x 20 reps   Toe Raise Limitations unable in standing; seated >1/2 AROM; added red band x 20 reps           PT Education - 04/20/17 1054    Education provided Yes   Education Details see additions to HEP   Person(s) Educated Patient   Methods Explanation;Demonstration;Verbal cues;Handout   Comprehension Verbalized understanding;Returned demonstration;Verbal cues required;Need further instruction          PT Short Term Goals - 04/13/17 2109      PT SHORT TERM GOAL #1   Title Patient will be independent with basic HEP to address balance deficits. (Target date all STGs: 05/11/17)   Time 4   Period Weeks   Status New     PT SHORT TERM GOAL #2   Title Patient will improve FGA score to >24/30 to indicate lesser fall risk and improved balance..   Baseline 22/30   Time 4   Period Weeks  Status New     PT SHORT TERM GOAL #3   Title Patient will improve Berg Balance score to >=45/56 to indicate lesser fall risk and improved balance.   Baseline 39/56   Time 4   Period Weeks   Status New     PT SHORT TERM GOAL #4   Title Patient will verbalize understanding of fall prevention strategies for his home environment.    Time 1   Period Weeks   Status New     PT SHORT TERM GOAL #5   Title Complete SOT via Balancemaster and set STG v LTG as appropriate.    Time 1   Period Weeks   Status Achieved     Additional Short Term Goals   Additional Short Term Goals Yes     PT SHORT TERM GOAL #6   Title Patient will not "fall"  on at least one trial out of three for condition 6 on SOT   Baseline fell on 3 of 3 trials   Time 4   Period Weeks   Status New           PT Long Term Goals - 04/13/17 2121      PT LONG TERM GOAL #1   Title Patient will be independent with updated HEP for balance (Target all LTGs 06/10/17)   Time 8   Period Weeks   Status New     PT LONG TERM GOAL #2   Title Patient will improve FGA score to >=27/30 to indicate lesser fall risk and improved balance.    Time 8   Period Weeks   Status New     PT LONG TERM GOAL #3   Title Patient will improve Berg score to >= 50/56 to indicate lesser fall risk and improved balance.   Time 8   Period Weeks   Status New     PT LONG TERM GOAL #4   Title Patient will verbalize a plan for continued community based activity upon discharge from PT   Time 8   Period Weeks   Status New     PT LONG TERM GOAL #5   Title Patient will improve his composite score to >=70 on SOT   Time 8   Period Weeks   Status New               Plan - 04/20/17 1056    Clinical Impression Statement Session focused on updating corner balance exercises as pt has improved. As progressing to add additional exercises, noted pt unable to bil heel raise in standing. Addressed PF/DF via HEP. Patient is very motivated and already required an update to his corner balance exercises.    Rehab Potential Good   Clinical Impairments Affecting Rehab Potential neuropathy bil LEs   PT Frequency 2x / week   PT Duration 8 weeks   PT Treatment/Interventions ADLs/Self Care Home Management;Functional mobility training;Stair training;Gait training;DME Instruction;Therapeutic activities;Therapeutic exercise;Balance training;Neuromuscular re-education;Patient/family education;Vestibular;Visual/perceptual remediation/compensation   PT Next Visit Plan review PF and DF added to HEP 10/11; give/discuss fall prevention information; Add to HEP (tandem walk, backwards, etc)   Consulted and  Agree with Plan of Care Patient      Patient will benefit from skilled therapeutic intervention in order to improve the following deficits and impairments:  Decreased balance, Decreased knowledge of use of DME, Difficulty walking, Impaired perceived functional ability, Impaired sensation, Postural dysfunction  Visit Diagnosis: Other abnormalities of gait and mobility  Muscle weakness (generalized)  Problem List There are no active problems to display for this patient.   Rexanne Mano, PT 04/20/2017, 12:32 PM  [facility] [address] [place], [place], [address] Phone: [phone]   Fax:  [phone]  Name: Derrick Donaldson MRN: [id number] Date of Birth: May 12, 1951

## 2017-04-21 ENCOUNTER — Ambulatory Visit: Payer: Medicare Other | Admitting: Physical Therapy

## 2017-04-25 ENCOUNTER — Ambulatory Visit: Payer: Medicare Other | Admitting: Physical Therapy

## 2017-04-25 ENCOUNTER — Encounter: Payer: Self-pay | Admitting: Physical Therapy

## 2017-04-25 DIAGNOSIS — R208 Other disturbances of skin sensation: Secondary | ICD-10-CM

## 2017-04-25 DIAGNOSIS — R2689 Other abnormalities of gait and mobility: Secondary | ICD-10-CM

## 2017-04-25 DIAGNOSIS — R29818 Other symptoms and signs involving the nervous system: Secondary | ICD-10-CM

## 2017-04-25 DIAGNOSIS — M6281 Muscle weakness (generalized): Secondary | ICD-10-CM

## 2017-04-25 NOTE — Patient Instructions (Addendum)
Back Wall Slide    With feet __1.5 to 2 ft__  from wall, lean your back against the wall. Gently squat down _10-12__ inches, keeping back against wall. Hold __3__ seconds while counting out loud. Repeat __10__ times. Do __1__ sessions per day.  http://gt2.exer.us/563   Copyright  VHI. All rights reserved.   Sit to Stand Transfers:  1. Scoot out to the edge of the chair 2. Place your feet flat on the floor, shoulder width apart.  Make sure your feet are tucked just under your knees. 3. Lean forward (nose over toes) with momentum, and stand up tall with your best posture.  If you need to use your arms, use them as a quick boost up to stand. 4. If you are in a low or soft chair, you can lean back and then forward up to stand, in order to get more momentum. 5. Once you are standing, make sure you are looking ahead and standing tall.  To sit down:  1. Back up until you feel the chair behind your legs. 2. Bend at you hips, reaching  Back for you chair, if needed, then slowly squat to sit down on your chair. Functional Quadriceps: Sit to Stand    Sit on edge of chair, feet flat on floor. Stand upright, extending knees fully. Pause standing "tall." Repeat __15-20 reps__ times per set. Do _1___ sets per session. Do ___1_ sessions per day.  *find a chair/surface that you can just stand up from without using your arms  http://orth.exer.us/734   Copyright  VHI. All rights reserved.

## 2017-04-25 NOTE — Therapy (Signed)
[facility] [address] [place], [place], [address] Phone: [phone]   Fax:  [phone]  Physical Therapy Treatment  Patient Details  Name: Derrick Donaldson MRN: [id number] Date of Birth: April 11, 1951 Referring Provider: Lajean Manes MD  Encounter Date: 04/25/2017      PT End of Session - 04/25/17 2027    Visit Number 4   Number of Visits 17   Date for PT Re-Evaluation 05/11/17   Authorization Type BCBS   PT Start Time 8315   PT Stop Time 1233   PT Time Calculation (min) 48 min   Equipment Utilized During Treatment Gait belt   Activity Tolerance Patient tolerated treatment well   Behavior During Therapy [facility] for tasks assessed/performed      Past Medical History:  Diagnosis Date  . History of kidney stones     Past Surgical History:  Procedure Laterality Date  . COLONOSCOPY WITH PROPOFOL N/A 09/23/2014   Procedure: COLONOSCOPY WITH PROPOFOL;  Surgeon: [name], MD;  Location: [facility];  Service: Endoscopy;  Laterality: N/A;  . knee arthroscopic surgery    . TONSILLECTOMY      There were no vitals filed for this visit.      Subjective Assessment - 04/25/17 1146    Subjective No falls or near falls. Has successfully been using compensatory technique of hand lightly on wall when walking in low light, or on back of student's seats in classroom. He reports he has become much more aware of his balance deficits.    Pertinent History Peripheral neuropathy. Lumbar disc disease. Restless leg syndrome, hearing loss R ear   Limitations Standing;Walking   How long can you stand comfortably? 15 minutes   How long can you walk comfortably? 30 minutes   Patient Stated Goals better balance; easier getting out of the chair; less falls and stumbling; better balance when riding bike (and stopping with clipss)   Currently in Pain? No/denies                         [facility] Adult PT  Treatment/Exercise - 04/25/17 1241      Transfers   Sit to Stand 6: Modified independent (Device/Increase time);5: Supervision;Without upper extremity assist   Stand to Sit 5: Supervision   Comments 10 reps x 2 sets; vc for technique (not pressing legs back into surface; not pushing hands off thighs; limiting use of momentum; foot placement)     Ambulation/Gait   Ambulation/Gait Yes   Ambulation/Gait Assistance 5: Supervision   Ambulation/Gait Assistance Details vc for upright posture over stance leg (especially knee extension)   Ambulation Distance (Feet) 100 Feet   Assistive device None   Gait Pattern Step-through pattern;Decreased arm swing - right;Decreased arm swing - left;Lateral hip instability   Ambulation Surface Level;Indoor     Exercises   Exercises Knee/Hip;Ankle     Knee/Hip Exercises: Machines for Strengthening   Cybex Leg Press 80# bil LEs x 20; 80# unilateral x 10 each leg     Knee/Hip Exercises: Standing   Wall Squat 1 set;10 reps;3 seconds   Wall Squat Limitations pt attempting to achieve 90/90 position and by last 2-3 reps he almost could not press himself back up     Ankle Exercises: Standing   Rocker Board Limitations pressing down with toes, hold 5 sec; press down thru heels/toes up x 5 sec (5 reps each way); balance level with feet together EO; when LOB occurred  cued to step off board with one big step (ant or post) to catch his balance   Heel Raises 10 reps;5 seconds   Heel Raises Limitations on bottom step, heels drop below horizontal, press up to raise heels             Balance Exercises - 04/25/17 2023      Balance Exercises: Standing   Standing Eyes Opened Head turns;Narrow base of support (BOS);Foam/compliant surface;4 reps   Standing Eyes Closed Narrow base of support (BOS);3 reps  large sway with occasional LOB   Tandem Gait  SLS with cone taps and min assist for balance  Standing EO, EC, marching on ramp with blue mat  Intermittent upper  extremity support;4 reps           PT Education - 04/25/17 2027    Education provided Yes   Education Details see addiitions to HEP   Person(s) Educated Patient   Methods Explanation;Demonstration;Verbal cues;Handout   Comprehension Verbalized understanding;Returned demonstration;Verbal cues required;Need further instruction          PT Short Term Goals - 04/13/17 2109      PT SHORT TERM GOAL #1   Title Patient will be independent with basic HEP to address balance deficits. (Target date all STGs: 05/11/17)   Time 4   Period Weeks   Status New     PT SHORT TERM GOAL #2   Title Patient will improve FGA score to >24/30 to indicate lesser fall risk and improved balance..   Baseline 22/30   Time 4   Period Weeks   Status New     PT SHORT TERM GOAL #3   Title Patient will improve Berg Balance score to >=45/56 to indicate lesser fall risk and improved balance.   Baseline 39/56   Time 4   Period Weeks   Status New     PT SHORT TERM GOAL #4   Title Patient will verbalize understanding of fall prevention strategies for his home environment.    Time 1   Period Weeks   Status New     PT SHORT TERM GOAL #5   Title Complete SOT via Balancemaster and set STG v LTG as appropriate.    Time 1   Period Weeks   Status Achieved     Additional Short Term Goals   Additional Short Term Goals Yes     PT SHORT TERM GOAL #6   Title Patient will not "fall" on at least one trial out of three for condition 6 on SOT   Baseline fell on 3 of 3 trials   Time 4   Period Weeks   Status New           PT Long Term Goals - 04/13/17 2121      PT LONG TERM GOAL #1   Title Patient will be independent with updated HEP for balance (Target all LTGs 06/10/17)   Time 8   Period Weeks   Status New     PT LONG TERM GOAL #2   Title Patient will improve FGA score to >=27/30 to indicate lesser fall risk and improved balance.    Time 8   Period Weeks   Status New     PT LONG TERM GOAL #3    Title Patient will improve Berg score to >= 50/56 to indicate lesser fall risk and improved balance.   Time 8   Period Weeks   Status New     PT LONG TERM  GOAL #4   Title Patient will verbalize a plan for continued community based activity upon discharge from PT   Time 8   Period Weeks   Status New     PT LONG TERM GOAL #5   Title Patient will improve his composite score to >=70 on SOT   Time 8   Period Weeks   Status New               Plan - 04/25/17 2028    Clinical Impression Statement Session focused on gait and balance training, fall prevention information, and LE strengthening. Patient with multiple episodes of near knee buckling and catches himself to return to full upright standing. Jerky quality of movement appears due to? coordination (?CNS) deficit vs lack of strength and general poor body awareness. Patient very motivated to improve and willcontine to progress with skilled PT   Rehab Potential Good   Clinical Impairments Affecting Rehab Potential neuropathy bil LEs   PT Frequency 2x / week   PT Duration 8 weeks   PT Treatment/Interventions ADLs/Self Care Home Management;Functional mobility training;Stair training;Gait training;DME Instruction;Therapeutic activities;Therapeutic exercise;Balance training;Neuromuscular re-education;Patient/family education;Vestibular;Visual/perceptual remediation/compensation   PT Next Visit Plan review ex's added to HEP 10/16; ? if has ?'s re: fall prevention information; ?quick screen for CNS deficits/signs (tho coordination intact on eval). Add to HEP (tandem walk, backwards, etc)   Consulted and Agree with Plan of Care Patient      Patient will benefit from skilled therapeutic intervention in order to improve the following deficits and impairments:  Decreased balance, Decreased knowledge of use of DME, Difficulty walking, Impaired perceived functional ability, Impaired sensation, Postural dysfunction  Visit Diagnosis: Other  abnormalities of gait and mobility  Muscle weakness (generalized)  Other disturbances of skin sensation  Other symptoms and signs involving the nervous system     Problem List There are no active problems to display for this patient.   Rexanne Mano, PT 04/25/2017, 8:37 PM  [facility] [address] [place], [place], [address] Phone: [phone]   Fax:  [phone]  Name: Derrick Donaldson MRN: [id number] Date of Birth: Jun 10, 1951

## 2017-04-27 ENCOUNTER — Ambulatory Visit: Payer: Medicare Other

## 2017-05-02 ENCOUNTER — Ambulatory Visit: Payer: Medicare Other | Admitting: Physical Therapy

## 2017-05-02 DIAGNOSIS — R2689 Other abnormalities of gait and mobility: Secondary | ICD-10-CM | POA: Diagnosis not present

## 2017-05-02 DIAGNOSIS — R29818 Other symptoms and signs involving the nervous system: Secondary | ICD-10-CM

## 2017-05-02 DIAGNOSIS — M6281 Muscle weakness (generalized): Secondary | ICD-10-CM | POA: Diagnosis not present

## 2017-05-02 DIAGNOSIS — R208 Other disturbances of skin sensation: Secondary | ICD-10-CM | POA: Diagnosis not present

## 2017-05-03 NOTE — Therapy (Signed)
[facility] [address] [place], [place], [address] Phone: [phone]   Fax:  [phone]  Physical Therapy Treatment  Patient Details  Name: Derrick Donaldson MRN: [id number] Date of Birth: 1951/04/10 Referring Provider: Lajean Manes MD  Encounter Date: 05/02/2017      PT End of Session - 05/03/17 1207    Visit Number 5   Number of Visits 17   Date for PT Re-Evaluation 05/11/17   Authorization Type BCBS   PT Start Time 1105   PT Stop Time 1143   PT Time Calculation (min) 38 min   Equipment Utilized During Treatment Gait belt   Activity Tolerance Patient tolerated treatment well   Behavior During Therapy WFL for tasks assessed/performed      Past Medical History:  Diagnosis Date  . History of kidney stones     Past Surgical History:  Procedure Laterality Date  . COLONOSCOPY WITH PROPOFOL N/A 09/23/2014   Procedure: COLONOSCOPY WITH PROPOFOL;  Surgeon: [name], MD;  Location: [facility];  Service: Endoscopy;  Laterality: N/A;  . knee arthroscopic surgery    . TONSILLECTOMY      There were no vitals filed for this visit.      Subjective Assessment - 05/02/17 1107    Subjective No new falls.  Trying to do the exercises almost daily.   Pertinent History Peripheral neuropathy. Lumbar disc disease. Restless leg syndrome, hearing loss R ear   Limitations Standing;Walking   How long can you stand comfortably? 15 minutes   How long can you walk comfortably? 30 minutes   Patient Stated Goals better balance; easier getting out of the chair; less falls and stumbling; better balance when riding bike (and stopping with clipss)   Currently in Pain? No/denies                         [facility] Adult PT Treatment/Exercise - 05/03/17 1152      Knee/Hip Exercises: Standing   Wall Squat 1 set;10 reps;3 seconds   Wall Squat Limitations Cue to move legs forward, as pt tries to go down too far,  heels come off ground and he needs assistance to come back up to standing; cues to slow pace   Other Standing Knee Exercises With sit<>stand (see below), pt has difficulty with foot placement, and heels come up off the floor and he has several episodes of pitching forward, with taking steps to regain balance and pitching backwards, taking small steps to regain balance.  Therapist provides close supervision     Knee/Hip Exercises: Seated   Sit to Sand 1 set;10 reps  plus extra reps, cues for safety and technique             Balance Exercises - 05/02/17 1127      Balance Exercises: Standing   Standing Eyes Opened Wide (BOA);Narrow base of support (BOS);Foam/compliant surface;Head turns;5 reps  Head nods x 5 reps   Standing Eyes Closed Wide (BOA);Narrow base of support (BOS);Foam/compliant surface;Head turns;5 reps  Head nods feet wide; feet narrow EC steady 10 sec   Rockerboard Anterior/posterior;EO;Head turns  Head nods   Tandem Gait Forward;Intermittent upper extremity support;4 reps  Cues for UE support   Retro Gait Upper extremity support;4 reps  Forward/back walking at [employer] posterior lean on PT   Marching Limitations Attempted marching on pillows in corner with UE support x 5 reps   Other Standing Exercises On rockerboard, ankle/hip strategy work  anterior/posterior with UE support; with attempts at maintaining balance midline on rockerboard, alternating UE lifts, bilateral UE lifts, UE rotation, with several episodes of strong posterior lean and needing to reach UEs to parallel bars to regain balance.  Step taps forward to floor on rockerboard x 10 reps     Briefly assessed vision-smooth pursuits/tracking lateral and up and down.  With quicker pursuits, pt tends to have saccades noted with side to side.  Finger to nose test appears WNL.        PT Short Term Goals - 04/13/17 2109      PT SHORT TERM GOAL #1   Title Patient will be independent with basic HEP to  address balance deficits. (Target date all STGs: 05/11/17)   Time 4   Period Weeks   Status New     PT SHORT TERM GOAL #2   Title Patient will improve FGA score to >24/30 to indicate lesser fall risk and improved balance..   Baseline 22/30   Time 4   Period Weeks   Status New     PT SHORT TERM GOAL #3   Title Patient will improve Berg Balance score to >=45/56 to indicate lesser fall risk and improved balance.   Baseline 39/56   Time 4   Period Weeks   Status New     PT SHORT TERM GOAL #4   Title Patient will verbalize understanding of fall prevention strategies for his home environment.    Time 1   Period Weeks   Status New     PT SHORT TERM GOAL #5   Title Complete SOT via Balancemaster and set STG v LTG as appropriate.    Time 1   Period Weeks   Status Achieved     Additional Short Term Goals   Additional Short Term Goals Yes     PT SHORT TERM GOAL #6   Title Patient will not "fall" on at least one trial out of three for condition 6 on SOT   Baseline fell on 3 of 3 trials   Time 4   Period Weeks   Status New           PT Long Term Goals - 04/13/17 2121      PT LONG TERM GOAL #1   Title Patient will be independent with updated HEP for balance (Target all LTGs 06/10/17)   Time 8   Period Weeks   Status New     PT LONG TERM GOAL #2   Title Patient will improve FGA score to >=27/30 to indicate lesser fall risk and improved balance.    Time 8   Period Weeks   Status New     PT LONG TERM GOAL #3   Title Patient will improve Berg score to >= 50/56 to indicate lesser fall risk and improved balance.   Time 8   Period Weeks   Status New     PT LONG TERM GOAL #4   Title Patient will verbalize a plan for continued community based activity upon discharge from PT   Time 8   Period Weeks   Status New     PT LONG TERM GOAL #5   Title Patient will improve his composite score to >=70 on SOT   Time 8   Period Weeks   Status New               Plan -  05/03/17 1209    Clinical Impression Statement Reviewed HEP from last  visit, with pt needing muliple cues to perform safetly.  Pt continues to have jerky quality of movement with wall squats, sit<>stand, corner balance activities and rockerboard.  He tends to raise heels from ground and L knee near-buckles.  Pt reports no awareness of this happening.  Pt also has strong posterior lean with retro walking at counter and with rocker board activities, needing therapist assist, UE support to regain balance.  PT will continue to benefit from PT to address balance towards LTGs.   Rehab Potential Good   Clinical Impairments Affecting Rehab Potential neuropathy bil LEs   PT Frequency 2x / week   PT Duration 8 weeks   PT Treatment/Interventions ADLs/Self Care Home Management;Functional mobility training;Stair training;Gait training;DME Instruction;Therapeutic activities;Therapeutic exercise;Balance training;Neuromuscular re-education;Patient/family education;Vestibular;Visual/perceptual remediation/compensation   PT Next Visit Plan  ?quick screen for CNS deficits/signs (tho coordination intact on eval). Add to HEP (tandem walk, backwards-if safe with these activities (did not given 10/23 due to strong posterior lean)   Consulted and Agree with Plan of Care Patient      Patient will benefit from skilled therapeutic intervention in order to improve the following deficits and impairments:  Decreased balance, Decreased knowledge of use of DME, Difficulty walking, Impaired perceived functional ability, Impaired sensation, Postural dysfunction  Visit Diagnosis: Other abnormalities of gait and mobility  Other symptoms and signs involving the nervous system     Problem List There are no active problems to display for this patient.   Jessika Rothery W. 05/03/2017, 12:14 PM  Frazier Butt., PT   [facility] [address] [place], [place],  [address] Phone: [phone]   Fax:  [phone]  Name: Derrick Donaldson MRN: [id number] Date of Birth: [birth year]

## 2017-05-04 ENCOUNTER — Encounter: Payer: Self-pay | Admitting: Physical Therapy

## 2017-05-04 ENCOUNTER — Ambulatory Visit: Payer: Medicare Other | Admitting: Physical Therapy

## 2017-05-04 DIAGNOSIS — R29818 Other symptoms and signs involving the nervous system: Secondary | ICD-10-CM

## 2017-05-04 DIAGNOSIS — M6281 Muscle weakness (generalized): Secondary | ICD-10-CM | POA: Diagnosis not present

## 2017-05-04 DIAGNOSIS — R2689 Other abnormalities of gait and mobility: Secondary | ICD-10-CM | POA: Diagnosis not present

## 2017-05-04 DIAGNOSIS — R208 Other disturbances of skin sensation: Secondary | ICD-10-CM | POA: Diagnosis not present

## 2017-05-04 NOTE — Therapy (Signed)
[facility] [address] [place], [place], [address] Phone: [phone]   Fax:  [phone]  Physical Therapy Treatment  Patient Details  Name: Derrick Donaldson MRN: [id number] Date of Birth: 04/21/1951 Referring Provider: Lajean Manes MD  Encounter Date: 05/04/2017      PT End of Session - 05/04/17 1251    Visit Number 6   Number of Visits 17   Date for PT Re-Evaluation 05/11/17   Authorization Type BCBS   PT Start Time 1100   PT Stop Time 1148   PT Time Calculation (min) 48 min   Equipment Utilized During Treatment Gait belt   Activity Tolerance Patient tolerated treatment well   Behavior During Therapy [facility] for tasks assessed/performed      Past Medical History:  Diagnosis Date  . History of kidney stones     Past Surgical History:  Procedure Laterality Date  . COLONOSCOPY WITH PROPOFOL N/A 09/23/2014   Procedure: COLONOSCOPY WITH PROPOFOL;  Surgeon: [name], MD;  Location: [facility];  Service: Endoscopy;  Laterality: N/A;  . knee arthroscopic surgery    . TONSILLECTOMY      There were no vitals filed for this visit.      Subjective Assessment - 05/04/17 1102    Subjective No new complaints, no falls, no pain to report   Pertinent History Peripheral neuropathy. Lumbar disc disease. Restless leg syndrome, hearing loss R ear   Limitations Standing;Walking   How long can you stand comfortably? 15 minutes   How long can you walk comfortably? 30 minutes   Patient Stated Goals better balance; easier getting out of the chair; less falls and stumbling; better balance when riding bike (and stopping with clipss)   Currently in Pain? No/denies            [facility] Adult PT Treatment/Exercise - 05/04/17 0001      Ambulation/Gait   Stairs Yes   Stairs Assistance 4: Min guard   Stair Management Technique Alternating pattern;One rail Right;No rails;Forwards   Number of Stairs 4  x5 up/down    Height of Stairs 6   Gait Comments Pt min guard on stairs, initially used one rail/progressed to no rail. Pt presents with poor foot clearance on the last step multiple times, pt demonstrates poor proprioceptive awareness of LE's with this activity.      Balance   Balance Assessed Yes     Dynamic Standing Balance   Rocker board comments: In paralell bars: rocking heel to toe, EO head turns/head nods/diagonals each way. Pt had high difficulty with this task, Mod assist, cues for posture, knees very unstable. Pt to progress to eyes closed.    Dynamic Standing - Comments Pt in corner standing on airex with chair in front. Pt demonstrating head turns/head nods/diagonals both ways with EO/EC. Pt domonstrated slight difficulty with EO, more difficulty with EC and most directional difficulty with diagonals. Pt very wobbly at knees and signifigant postural sway. Min assist required      High Level Balance   High Level Balance Activities Tandem walking;Backward walking;Other (comment)  x5 Fwd/bkwds: Toe/heel walking, marching    High Level Balance Comments At counter with RUE support: Pt did well with walking marches, showed signifigant difficulty with heel and toe walking with cues on posture and to slow down when going backwards. Pt did well with tandem going forwards, showed signifigant difficulty with backwards tandem. Pt not yet ready to perform at home with HEP.  Neuro Re-ed    Neuro Re-ed Details  Paralell bars: cone taps x5, min/mod A, cues on posture and lightly tapping, pt demonstrated signifigant instability with this task initially but instability decreased some by the last lap.               PT Short Term Goals - 05/04/17 1251      PT SHORT TERM GOAL #1   Title Patient will be independent with basic HEP to address balance deficits. (Target date all STGs: 05/11/17)   Time 4   Period Weeks   Status New     PT SHORT TERM GOAL #2   Title Patient will improve FGA score to >24/30  to indicate lesser fall risk and improved balance..   Baseline 22/30   Time 4   Period Weeks   Status New     PT SHORT TERM GOAL #3   Title Patient will improve Berg Balance score to >=45/56 to indicate lesser fall risk and improved balance.   Baseline 39/56   Time 4   Period Weeks   Status New     PT SHORT TERM GOAL #4   Title Patient will verbalize understanding of fall prevention strategies for his home environment.    Time 1   Period Weeks   Status New     PT SHORT TERM GOAL #5   Title Complete SOT via Balancemaster and set STG v LTG as appropriate.    Time 1   Period Weeks   Status Achieved     PT SHORT TERM GOAL #6   Title Patient will not "fall" on at least one trial out of three for condition 6 on SOT   Baseline fell on 3 of 3 trials   Time 4   Period Weeks   Status New           PT Long Term Goals - 05/04/17 1251      PT LONG TERM GOAL #1   Title Patient will be independent with updated HEP for balance (Target all LTGs 06/10/17)   Time 8   Period Weeks   Status New     PT LONG TERM GOAL #2   Title Patient will improve FGA score to >=27/30 to indicate lesser fall risk and improved balance.    Time 8   Period Weeks   Status New     PT LONG TERM GOAL #3   Title Patient will improve Berg score to >= 50/56 to indicate lesser fall risk and improved balance.   Time 8   Period Weeks   Status New     PT LONG TERM GOAL #4   Title Patient will verbalize a plan for continued community based activity upon discharge from PT   Time 8   Period Weeks   Status New     PT LONG TERM GOAL #5   Title Patient will improve his composite score to >=70 on SOT   Time 8   Period Weeks   Status New           Plan - 05/04/17 1252    Clinical Impression Statement Pt tolerated treatment well with no limitations due to pain or fatigue. Todays session focused on balance activities and LE strengthening. Pt shows slight to signifigant difficulty with dynamic balance  activities. Knees are very unstable and want to buckle during activity. Pt would benefit from continued PT to progess towards goals.    Rehab Potential Good   Clinical Impairments  Affecting Rehab Potential neuropathy bil LEs   PT Frequency 2x / week   PT Duration 8 weeks   PT Treatment/Interventions ADLs/Self Care Home Management;Functional mobility training;Stair training;Gait training;DME Instruction;Therapeutic activities;Therapeutic exercise;Balance training;Neuromuscular re-education;Patient/family education;Vestibular;Visual/perceptual remediation/compensation   PT Next Visit Plan Continue working on dynamic standing balance activities (progressing towards Eyes closed activities) and LE strengthening. Add to HEP (tandem walk, backwards-if safe with these activities.   Consulted and Agree with Plan of Care Patient      Patient will benefit from skilled therapeutic intervention in order to improve the following deficits and impairments:  Decreased balance, Decreased knowledge of use of DME, Difficulty walking, Impaired perceived functional ability, Impaired sensation, Postural dysfunction  Visit Diagnosis: Other abnormalities of gait and mobility  Other symptoms and signs involving the nervous system     Problem List There are no active problems to display for this patient.  Ryah Cribb, SPTA  Shyniece Scripter 05/04/2017, 12:59 PM  [facility] [address] [place], [place], [address] Phone: [phone]   Fax:  [phone]  Name: Derrick Donaldson MRN: [id number] Date of Birth: 11-Oct-1950

## 2017-05-09 ENCOUNTER — Ambulatory Visit: Payer: Medicare Other | Admitting: Physical Therapy

## 2017-05-09 ENCOUNTER — Encounter: Payer: Self-pay | Admitting: Physical Therapy

## 2017-05-09 DIAGNOSIS — R2689 Other abnormalities of gait and mobility: Secondary | ICD-10-CM

## 2017-05-09 DIAGNOSIS — R208 Other disturbances of skin sensation: Secondary | ICD-10-CM | POA: Diagnosis not present

## 2017-05-09 DIAGNOSIS — R29818 Other symptoms and signs involving the nervous system: Secondary | ICD-10-CM | POA: Diagnosis not present

## 2017-05-09 DIAGNOSIS — M6281 Muscle weakness (generalized): Secondary | ICD-10-CM | POA: Diagnosis not present

## 2017-05-09 NOTE — Therapy (Signed)
[facility] [address] [place], [place], [address] Phone: [phone]   Fax:  [phone]  Physical Therapy Treatment  Patient Details  Name: Derrick Donaldson MRN: [id number] Date of Birth: [birth year] Referring Provider: Lajean Manes MD  Encounter Date: 05/09/2017      PT End of Session - 05/09/17 1801    Visit Number 7   Number of Visits 17   Date for PT Re-Evaluation 05/11/17   Authorization Type BCBS   PT Start Time 1102   PT Stop Time 1144   PT Time Calculation (min) 42 min   Activity Tolerance Patient tolerated treatment well   Behavior During Therapy [facility] for tasks assessed/performed      Past Medical History:  Diagnosis Date  . History of kidney stones     Past Surgical History:  Procedure Laterality Date  . COLONOSCOPY WITH PROPOFOL N/A 09/23/2014   Procedure: COLONOSCOPY WITH PROPOFOL;  Surgeon: [name], MD;  Location: [facility];  Service: Endoscopy;  Laterality: N/A;  . knee arthroscopic surgery    . TONSILLECTOMY      There were no vitals filed for this visit.      Subjective Assessment - 05/09/17 1107    Subjective I can tell I"m getting better. I'm doing most of my exercises... I haven't been doing the corner exercise with my eyes closed.    Pertinent History Peripheral neuropathy. Lumbar disc disease. Restless leg syndrome, hearing loss R ear   Limitations Standing;Walking   How long can you stand comfortably? 15 minutes   How long can you walk comfortably? 30 minutes   Patient Stated Goals better balance; easier getting out of the chair; less falls and stumbling; better balance when riding bike (and stopping with clipss)   Currently in Pain? No/denies            [facility] PT Assessment - 05/09/17 0001      Berg Balance Test   Sit to Stand Able to stand without using hands and stabilize independently   Standing Unsupported Able to stand safely 2 minutes   Sitting with  Back Unsupported but Feet Supported on Floor or Stool Able to sit safely and securely 2 minutes   Stand to Sit Sits safely with minimal use of hands   Transfers Able to transfer safely, minor use of hands   Standing Unsupported with Eyes Closed Able to stand 10 seconds safely   Standing Ubsupported with Feet Together Able to place feet together independently and stand 1 minute safely   From Standing, Reach Forward with Outstretched Arm Can reach confidently >25 cm (10")   From Standing Position, Pick up Object from Floor Able to pick up shoe safely and easily   From Standing Position, Turn to Look Behind Over each Shoulder Looks behind from both sides and weight shifts well   Turn 360 Degrees Able to turn 360 degrees safely in 4 seconds or less   Standing Unsupported, Alternately Place Feet on Step/Stool Able to stand independently and complete 8 steps >20 seconds   Standing Unsupported, One Foot in Front Able to plae foot ahead of the other independently and hold 30 seconds   Standing on One Leg Tries to lift leg/unable to hold 3 seconds but remains standing independently   Total Score 51     Functional Gait  Assessment   Gait Level Surface Walks 20 ft in less than 7 sec but greater than 5.5 sec, uses assistive device, slower speed,  mild gait deviations, or deviates 6-10 in outside of the 12 in walkway width.  5.72   Change in Gait Speed Able to smoothly change walking speed without loss of balance or gait deviation. Deviate no more than 6 in outside of the 12 in walkway width.   Gait with Horizontal Head Turns Performs head turns smoothly with no change in gait. Deviates no more than 6 in outside 12 in walkway width   Gait with Vertical Head Turns Performs head turns with no change in gait. Deviates no more than 6 in outside 12 in walkway width.   Gait and Pivot Turn Pivot turns safely within 3 sec and stops quickly with no loss of balance.   Step Over Obstacle Is able to step over 2 stacked  shoe boxes taped together (9 in total height) without changing gait speed. No evidence of imbalance.   Gait with Narrow Base of Support Ambulates less than 4 steps heel to toe or cannot perform without assistance.   Gait with Eyes Closed Walks 20 ft, slow speed, abnormal gait pattern, evidence for imbalance, deviates 10-15 in outside 12 in walkway width. Requires more than 9 sec to ambulate 20 ft.   Ambulating Backwards Walks 20 ft, no assistive devices, good speed, no evidence for imbalance, normal gait   Steps Alternating feet, no rail.   Total Score 24            Vestibular Assessment - 05/09/17 0001      Occulomotor Exam   Occulomotor Alignment Normal   Spontaneous Absent   Gaze-induced Absent   Smooth Pursuits Intact     Vestibulo-Occular Reflex   VOR Cancellation Normal                      Balance Exercises - 05/09/17 1755      Balance Exercises: Standing   Standing Eyes Opened Narrow base of support (BOS);Wide (BOA);Head turns;Foam/compliant surface  EO head turns;    Standing Eyes Closed Narrow base of support (BOS);Wide (BOA);Foam/compliant surface  EC loses balance posteriorly           PT Education - 05/09/17 1800    Education provided Yes   Education Details results of assessments for STGs; importance of doing exercises with eyes closed   Person(s) Educated Patient   Methods Explanation;Demonstration   Comprehension Verbalized understanding;Returned demonstration          PT Short Term Goals - 05/09/17 1758      PT SHORT TERM GOAL #1   Title Patient will be independent with basic HEP to address balance deficits. (Target date all STGs: 05/11/17)   Time 4   Period Weeks   Status Achieved     PT SHORT TERM GOAL #2   Title Patient will improve FGA score to >=24/30 to indicate lesser fall risk and improved balance..   Baseline 22/30; 10/30 24/30   Time 4   Period Weeks   Status Achieved     PT SHORT TERM GOAL #3   Title Patient  will improve Berg Balance score to >=45/56 to indicate lesser fall risk and improved balance.   Baseline 39/56; 10/30 51/56   Time 4   Period Weeks   Status Achieved     PT SHORT TERM GOAL #4   Title Patient will verbalize understanding of fall prevention strategies for his home environment.    Baseline reports he was given fall prevention "checklist" to review for his home and his home  was already in good shape   Time 1   Period Weeks   Status Achieved     PT SHORT TERM GOAL #5   Title Complete SOT via Balancemaster and set STG v LTG as appropriate.    Time 1   Period Weeks   Status Achieved     PT SHORT TERM GOAL #6   Title Patient will not "fall" on at least one trial out of three for condition 6 on SOT   Baseline fell on 3 of 3 trials; 10/30 fell on 3 of 3 trials   Time 4   Period Weeks   Status Not Met           PT Long Term Goals - 05/04/17 1251      PT LONG TERM GOAL #1   Title Patient will be independent with updated HEP for balance (Target all LTGs 06/10/17)   Time 8   Period Weeks   Status New     PT LONG TERM GOAL #2   Title Patient will improve FGA score to >=27/30 to indicate lesser fall risk and improved balance.    Time 8   Period Weeks   Status New     PT LONG TERM GOAL #3   Title Patient will improve Berg score to >= 50/56 to indicate lesser fall risk and improved balance.   Time 8   Period Weeks   Status New     PT LONG TERM GOAL #4   Title Patient will verbalize a plan for continued community based activity upon discharge from PT   Time 8   Period Weeks   Status New     PT LONG TERM GOAL #5   Title Patient will improve his composite score to >=70 on SOT   Time [place] - 05/09/17 1802    Clinical Impression Statement Session focused on assessing progress towards STGs with patient meeting 5 of 6 goals. (He even met his LTG for the Berg Assessment). The sixth goal involving 6th condition  on Balancemaster was not met (EO, walls and floor moving). Patient states he knows he has improved and is pleased with his progress. He does admit he has not fully been doing his exercises as instructed and committed to adding back into his routine the exercises with EC. Anticipate patient can continue to benefit from PT to work towards achieving his LTGs and improved stability/balance.    Rehab Potential Good   Clinical Impairments Affecting Rehab Potential neuropathy bil LEs   PT Frequency 2x / week   PT Duration 8 weeks   PT Treatment/Interventions ADLs/Self Care Home Management;Functional mobility training;Stair training;Gait training;DME Instruction;Therapeutic activities;Therapeutic exercise;Balance training;Neuromuscular re-education;Patient/family education;Vestibular;Visual/perceptual remediation/compensation   PT Next Visit Plan Continue working on dynamic standing balance activities, EC firm surface and foam; LE strengthening. Add to HEP (?standing hip abdct, hip extension, SLS)   Consulted and Agree with Plan of Care Patient      Patient will benefit from skilled therapeutic intervention in order to improve the following deficits and impairments:  Decreased balance, Decreased knowledge of use of DME, Difficulty walking, Impaired perceived functional ability, Impaired sensation, Postural dysfunction  Visit Diagnosis: Other abnormalities of gait and mobility  Other symptoms and signs involving the nervous system     Problem List There are no active problems to display for this patient.  Rexanne Mano, PT 05/09/2017, 6:12 PM  [facility] [address] [place], [place], [address] Phone: [phone]   Fax:  [phone]  Name: KASHTEN GOWIN MRN: [id number] Date of Birth: Nov 25, 1950

## 2017-05-11 ENCOUNTER — Ambulatory Visit: Payer: Medicare Other | Attending: Geriatric Medicine | Admitting: Physical Therapy

## 2017-05-11 ENCOUNTER — Encounter: Payer: Self-pay | Admitting: Physical Therapy

## 2017-05-11 DIAGNOSIS — R2689 Other abnormalities of gait and mobility: Secondary | ICD-10-CM | POA: Diagnosis not present

## 2017-05-11 DIAGNOSIS — R29818 Other symptoms and signs involving the nervous system: Secondary | ICD-10-CM | POA: Diagnosis not present

## 2017-05-11 DIAGNOSIS — M6281 Muscle weakness (generalized): Secondary | ICD-10-CM | POA: Diagnosis not present

## 2017-05-11 NOTE — Therapy (Signed)
[facility] [address] [place], [place], [address] Phone: [phone]   Fax:  [phone]  Physical Therapy Treatment  Patient Details  Name: Derrick Donaldson MRN: [id number] Date of Birth: [birth year] Referring Provider: Lajean Manes MD  Encounter Date: 05/11/2017      PT End of Session - 05/11/17 1748    Visit Number 8   Number of Visits 17   Date for PT Re-Evaluation 05/11/17   Authorization Type BCBS   PT Start Time 1030   PT Stop Time 1114   PT Time Calculation (min) 44 min   Activity Tolerance Patient tolerated treatment well   Behavior During Therapy [facility] for tasks assessed/performed      Past Medical History:  Diagnosis Date  . History of kidney stones     Past Surgical History:  Procedure Laterality Date  . COLONOSCOPY WITH PROPOFOL N/A 09/23/2014   Procedure: COLONOSCOPY WITH PROPOFOL;  Surgeon: [name], MD;  Location: [facility];  Service: Endoscopy;  Laterality: N/A;  . knee arthroscopic surgery    . TONSILLECTOMY      There were no vitals filed for this visit.      Subjective Assessment - 05/11/17 1030    Subjective no falls; all is going well; did not do exercises on pillow EC yet. does have a place set up   Pertinent History Peripheral neuropathy. Lumbar disc disease. Restless leg syndrome, hearing loss R ear   Limitations Standing;Walking   How long can you stand comfortably? 15 minutes   How long can you walk comfortably? 30 minutes   Patient Stated Goals better balance; easier getting out of the chair; less falls and stumbling; better balance when riding bike (and stopping with clipss)                         [facility] Adult PT Treatment/Exercise - 05/11/17 0001      High Level Balance   High Level Balance Comments on red mat, large staggered stance weight-shifting onto front leg then to back leg as reaching for and moving cones from table to table; minguard  due to very off-balance     Knee/Hip Exercises: Standing   Hip Abduction Stengthening;Both;1 set  30 reps   Hip Extension Stengthening;Both;1 set;Knee bent  30 reps             Balance Exercises - 05/11/17 1740      Balance Exercises: Standing   Tandem Stance Eyes open;Foam/compliant surface;Intermittent upper extremity support;5 reps  each foot lead; <5 sec each trial   SLS Eyes open;Foam/compliant surface;Intermittent upper extremity support;5 reps   SLS with Vectors Foam/compliant surface  red mat; cones, colored dots   Gait with Head Turns Forward  down 40 ft hall, lt-rt, up-down, diagonals   Tandem Gait Forward;Intermittent upper extremity support;Foam/compliant surface;3 reps   Step Over Hurdles / Cones red mat-forwards, backwards, sideways w/ focus on slow, controlled steps    Other Standing Exercises on BOSU, EO, EC, squats x 10             PT Short Term Goals - 05/09/17 1758      PT SHORT TERM GOAL #1   Title Patient will be independent with basic HEP to address balance deficits. (Target date all STGs: 05/11/17)   Time 4   Period Weeks   Status Achieved     PT SHORT TERM GOAL #2   Title Patient will improve FGA  score to >=24/30 to indicate lesser fall risk and improved balance..   Baseline 22/30; 10/30 24/30   Time 4   Period Weeks   Status Achieved     PT SHORT TERM GOAL #3   Title Patient will improve Berg Balance score to >=45/56 to indicate lesser fall risk and improved balance.   Baseline 39/56; 10/30 51/56   Time 4   Period Weeks   Status Achieved     PT SHORT TERM GOAL #4   Title Patient will verbalize understanding of fall prevention strategies for his home environment.    Baseline reports he was given fall prevention "checklist" to review for his home and his home was already in good shape   Time 1   Period Weeks   Status Achieved     PT SHORT TERM GOAL #5   Title Complete SOT via Balancemaster and set STG v LTG as appropriate.     Time 1   Period Weeks   Status Achieved     PT SHORT TERM GOAL #6   Title Patient will not "fall" on at least one trial out of three for condition 6 on SOT   Baseline fell on 3 of 3 trials; 10/30 fell on 3 of 3 trials   Time 4   Period Weeks   Status Not Met           PT Long Term Goals - 05/04/17 1251      PT LONG TERM GOAL #1   Title Patient will be independent with updated HEP for balance (Target all LTGs 06/10/17)   Time 8   Period Weeks   Status New     PT LONG TERM GOAL #2   Title Patient will improve FGA score to >=27/30 to indicate lesser fall risk and improved balance.    Time 8   Period Weeks   Status New     PT LONG TERM GOAL #3   Title Patient will improve Berg score to >= 50/56 to indicate lesser fall risk and improved balance.   Time 8   Period Weeks   Status New     PT LONG TERM GOAL #4   Title Patient will verbalize a plan for continued community based activity upon discharge from PT   Time 8   Period Weeks   Status New     PT LONG TERM GOAL #5   Title Patient will improve his composite score to >=70 on SOT   Time 8   Period Weeks   Status New               Plan - 05/11/17 1749    Clinical Impression Statement Session focused on dynamic balance activities on compliant surfaces with weightshifting, EO, EC and walking with head turns, tossing ball. He continues with poor balance and staggering steps--appeared partially due to coordination. Coordination reassessed and intact, therefore feel it may be a lack of body awareness becoming apparent with novel balance activities (in combination with decr vestibular function and neuropathy). Pt can continue to benefit from PT   Rehab Potential Good   Clinical Impairments Affecting Rehab Potential neuropathy bil LEs   PT Frequency 2x / week   PT Duration 8 weeks   PT Treatment/Interventions ADLs/Self Care Home Management;Functional mobility training;Stair training;Gait training;DME  Instruction;Therapeutic activities;Therapeutic exercise;Balance training;Neuromuscular re-education;Patient/family education;Vestibular;Visual/perceptual remediation/compensation   PT Next Visit Plan Continue working on dynamic standing balance activities, EC firm surface and foam; LE strengthening. Add to HEP  Consulted and Agree with Plan of Care Patient      Patient will benefit from skilled therapeutic intervention in order to improve the following deficits and impairments:  Decreased balance, Decreased knowledge of use of DME, Difficulty walking, Impaired perceived functional ability, Impaired sensation, Postural dysfunction  Visit Diagnosis: Other abnormalities of gait and mobility  Other symptoms and signs involving the nervous system  Muscle weakness (generalized)     Problem List There are no active problems to display for this patient.   Rexanne Mano, PT 05/11/2017, 5:56 PM  [facility] [address] [place], [place], [address] Phone: [phone]   Fax:  [phone]  Name: Derrick Donaldson MRN: [id number] Date of Birth: 04/01/1951

## 2017-05-16 ENCOUNTER — Encounter: Payer: Self-pay | Admitting: Physical Therapy

## 2017-05-16 ENCOUNTER — Ambulatory Visit: Payer: Medicare Other | Admitting: Physical Therapy

## 2017-05-16 DIAGNOSIS — M6281 Muscle weakness (generalized): Secondary | ICD-10-CM | POA: Diagnosis not present

## 2017-05-16 DIAGNOSIS — R2689 Other abnormalities of gait and mobility: Secondary | ICD-10-CM

## 2017-05-16 DIAGNOSIS — R29818 Other symptoms and signs involving the nervous system: Secondary | ICD-10-CM

## 2017-05-16 NOTE — Therapy (Signed)
[facility] [address] [place], [place], [address] Phone: [phone]   Fax:  [phone]  Physical Therapy Treatment  Patient Details  Name: Derrick Donaldson MRN: [id number] Date of Birth: 10-Aug-1950 Referring Provider: Lajean Manes MD   Encounter Date: 05/16/2017  PT End of Session - 05/16/17 1151    Visit Number  9    Number of Visits  17    Date for PT Re-Evaluation  05/11/17    Authorization Type  BCBS    PT Start Time  1103    PT Stop Time  1144    PT Time Calculation (min)  41 min    Equipment Utilized During Treatment  Gait belt pt's belt   pt's belt   Activity Tolerance  Patient tolerated treatment well    Behavior During Therapy  WFL for tasks assessed/performed       Past Medical History:  Diagnosis Date  . History of kidney stones     Past Surgical History:  Procedure Laterality Date  . knee arthroscopic surgery    . TONSILLECTOMY      There were no vitals filed for this visit.  Subjective Assessment - 05/16/17 1101    Subjective  One big stumble in kitchen (caught himself)...cat had laid down behind him and he didn't know it and began to step and  then had to re-direct/stumlbed, Reaching down to clean up something off carpet and near LOB.     Pertinent History  Peripheral neuropathy. Lumbar disc disease. Restless leg syndrome, hearing loss R ear    Limitations  Standing;Walking    How long can you stand comfortably?  15 minutes    How long can you walk comfortably?  30 minutes    Patient Stated Goals  better balance; easier getting out of the chair; less falls and stumbling; better balance when riding bike (and stopping with clipss)                      [facility] Adult PT Treatment/Exercise - 05/16/17 0001      Transfers   Transfers  Floor to Transfer    Sit to Stand  4: Min guard low folding chair; 10 reps with LOB ~6/10   low folding chair; 10 reps with LOB ~6/10   Floor  to Transfer  6: Modified independent (Device/Increase time)    Comments  down to 1/2 kneeling and back up x 5 with pushing on lead leg with his hand; incr effort and imbalance with recovering in one step      Knee/Hip Exercises: Machines for Strengthening   Cybex Leg Press  120# x 15; 140# x 15          Balance Exercises - 05/16/17 1145      Balance Exercises: Standing   Standing Eyes Opened  Narrow base of support (BOS);Foam/compliant surface on ramp, tossing ball to himself   on ramp, tossing ball to himself   Standing Eyes Closed  Narrow base of support (BOS);Foam/compliant surface on ramp; upfacing/downfacing   on ramp; upfacing/downfacing   Gait with Head Turns  Forward 40 ft hall, diagonals, toss & circling ball    40 ft hall, diagonals, toss & circling ball        PT Education - 05/16/17 1150    Education provided  Yes    Education Details  use low toilet or folding chair for sit to stand at home; more safe positioning in 1/2 kneeling  if having to clean up cat mess on the floor; feels he got dizzy in head down position    Person(s) Educated  Patient    Methods  Explanation;Demonstration    Comprehension  Verbalized understanding;Returned demonstration       PT Short Term Goals - 05/09/17 1758      PT SHORT TERM GOAL #1   Title  Patient will be independent with basic HEP to address balance deficits. (Target date all STGs: 05/11/17)    Time  4    Period  Weeks    Status  Achieved      PT SHORT TERM GOAL #2   Title  Patient will improve FGA score to >=24/30 to indicate lesser fall risk and improved balance..    Baseline  22/30; 10/30 24/30    Time  4    Period  Weeks    Status  Achieved      PT SHORT TERM GOAL #3   Title  Patient will improve Berg Balance score to >=45/56 to indicate lesser fall risk and improved balance.    Baseline  39/56; 10/30 51/56    Time  4    Period  Weeks    Status  Achieved      PT SHORT TERM GOAL #4   Title  Patient will verbalize  understanding of fall prevention strategies for his home environment.     Baseline  reports he was given fall prevention "checklist" to review for his home and his home was already in good shape    Time  1    Period  Weeks    Status  Achieved      PT SHORT TERM GOAL #5   Title  Complete SOT via Balancemaster and set STG v LTG as appropriate.     Time  1    Period  Weeks    Status  Achieved      PT SHORT TERM GOAL #6   Title  Patient will not "fall" on at least one trial out of three for condition 6 on SOT    Baseline  fell on 3 of 3 trials; 10/30 fell on 3 of 3 trials    Time  4    Period  Weeks    Status  Not Met        PT Long Term Goals - 05/04/17 1251      PT LONG TERM GOAL #1   Title  Patient will be independent with updated HEP for balance (Target all LTGs 06/10/17)    Time  8    Period  Weeks    Status  New      PT LONG TERM GOAL #2   Title  Patient will improve FGA score to >=27/30 to indicate lesser fall risk and improved balance.     Time  8    Period  Weeks    Status  New      PT LONG TERM GOAL #3   Title  Patient will improve Berg score to >= 50/56 to indicate lesser fall risk and improved balance.    Time  8    Period  Weeks    Status  New      PT LONG TERM GOAL #4   Title  Patient will verbalize a plan for continued community based activity upon discharge from PT    Time  8    Period  Weeks    Status  New  PT LONG TERM GOAL #5   Title  Patient will improve his composite score to >=70 on SOT    Time  8    Period  Weeks    Status  New            Plan - 05/16/17 1152    Clinical Impression Statement  Session focused on bending and reaching activities as pt reports these are most difficult thing for him right now. Patient continues with increased difficulty with EC and compliant surfaces (due to neuropathy). Pt continues to benefit from PT working towards [place].    Rehab Potential  Good    Clinical Impairments Affecting Rehab Potential   neuropathy bil LEs    PT Frequency  2x / week    PT Duration  8 weeks    PT Treatment/Interventions  ADLs/Self Care Home Management;Functional mobility training;Stair training;Gait training;DME Instruction;Therapeutic activities;Therapeutic exercise;Balance training;Neuromuscular re-education;Patient/family education;Vestibular;Visual/perceptual remediation/compensation    PT Next Visit Plan  Neuropathy-Continue working on dynamic standing balance activities, EC firm surface and foam; LE strengthening.     Consulted and Agree with Plan of Care  Patient       Patient will benefit from skilled therapeutic intervention in order to improve the following deficits and impairments:  Decreased balance, Decreased knowledge of use of DME, Difficulty walking, Impaired perceived functional ability, Impaired sensation, Postural dysfunction  Visit Diagnosis: Other symptoms and signs involving the nervous system  Muscle weakness (generalized)  Other abnormalities of gait and mobility     Problem List There are no active problems to display for this patient.   [employer]. PT 05/16/2017, 11:56 AM  [facility] [address] [place], [place], [address] Phone: [phone]   Fax:  [phone]  Name: Derrick Donaldson MRN: [id number] Date of Birth: 1950/10/17

## 2017-05-18 ENCOUNTER — Encounter: Payer: Self-pay | Admitting: Physical Therapy

## 2017-05-18 ENCOUNTER — Ambulatory Visit: Payer: Medicare Other | Admitting: Physical Therapy

## 2017-05-18 DIAGNOSIS — M6281 Muscle weakness (generalized): Secondary | ICD-10-CM | POA: Diagnosis not present

## 2017-05-18 DIAGNOSIS — R2689 Other abnormalities of gait and mobility: Secondary | ICD-10-CM | POA: Diagnosis not present

## 2017-05-18 DIAGNOSIS — R29818 Other symptoms and signs involving the nervous system: Secondary | ICD-10-CM | POA: Diagnosis not present

## 2017-05-18 NOTE — Therapy (Signed)
[facility] [address] [place], [place], [address] Phone: [phone]   Fax:  [phone]  Physical Therapy Treatment  Patient Details  Name: Derrick Donaldson MRN: [id number] Date of Birth: [birth year] Referring Provider: Lajean Manes MD   Encounter Date: 05/18/2017  PT End of Session - 05/18/17 1155    Visit Number  10    Number of Visits  17    Date for PT Re-Evaluation  05/11/17    Authorization Type  BCBS    PT Start Time  1103    PT Stop Time  1145    PT Time Calculation (min)  42 min    Equipment Utilized During Treatment  Gait belt pt's belt   pt's belt   Activity Tolerance  Patient tolerated treatment well    Behavior During Therapy  WFL for tasks assessed/performed       Past Medical History:  Diagnosis Date  . History of kidney stones     Past Surgical History:  Procedure Laterality Date  . knee arthroscopic surgery    . TONSILLECTOMY      There were no vitals filed for this visit.  Subjective Assessment - 05/18/17 1108    Subjective  Had near LOB climbing tall bleacher steps at [employer] (no rail). Thought more an issue of balance than strength to get up the step. Has been modifying his tandem stance exercise to partial tandem due to difficulty.   (Pended)     Pertinent History  Peripheral neuropathy. Lumbar disc disease. Restless leg syndrome, hearing loss R ear  (Pended)     Limitations  Standing;Walking  (Pended)     How long can you stand comfortably?  15 minutes  (Pended)     How long can you walk comfortably?  30 minutes  (Pended)     Patient Stated Goals  better balance; easier getting out of the chair; less falls and stumbling; better balance when riding bike (and stopping with clipss)  (Pended)     Currently in Pain?  No/denies  (Pended)                            Balance Exercises - 05/18/17 1149      Balance Exercises: Standing   Standing Eyes Opened  Wide  (BOA);Foam/compliant surface reaching into high cabinet and then down low to put in dishw   reaching into high cabinet and then down low to put in dishw   Standing Eyes Closed  Narrow base of support (BOS);Head turns;Foam/compliant surface horiz, vertical, bil diagnonals   horiz, vertical, bil diagnonals   SLS  Eyes open;Foam/compliant surface;Upper extremity support 1;4 reps;10 secs single finger support to no support   single finger support to no support   Stepping Strategy  Posterior;Lateral;Foam/compliant surface;5 reps able to recover with 1-2 steps post, 2 steps lateral   able to recover with 1-2 steps post, 2 steps lateral   Step Ups  Forward ~9-10 in step; no UE support vs one walking stick   ~9-10 in step; no UE support vs one walking stick   Balance Beam  blue beam, feet together EC; also step off/on        PT Education - 05/18/17 1153    Education provided  Yes    Education Details  discussed use of walking stick(s) (he already owns a set) when going to an [employer], [employer] or entertainment center as often run into  no rails; advocated he begin a walking program for further balance improvement; discussed wearing relatively heavier shoes for incr sensory input    Person(s) Educated  Patient    Methods  Explanation;Demonstration    Comprehension  Verbalized understanding;Returned demonstration       PT Short Term Goals - 05/09/17 1758      PT SHORT TERM GOAL #1   Title  Patient will be independent with basic HEP to address balance deficits. (Target date all STGs: 05/11/17)    Time  4    Period  Weeks    Status  Achieved      PT SHORT TERM GOAL #2   Title  Patient will improve FGA score to >=24/30 to indicate lesser fall risk and improved balance..    Baseline  22/30; 10/30 24/30    Time  4    Period  Weeks    Status  Achieved      PT SHORT TERM GOAL #3   Title  Patient will improve Berg Balance score to >=45/56 to indicate lesser fall risk and improved balance.     Baseline  39/56; 10/30 51/56    Time  4    Period  Weeks    Status  Achieved      PT SHORT TERM GOAL #4   Title  Patient will verbalize understanding of fall prevention strategies for his home environment.     Baseline  reports he was given fall prevention "checklist" to review for his home and his home was already in good shape    Time  1    Period  Weeks    Status  Achieved      PT SHORT TERM GOAL #5   Title  Complete SOT via Balancemaster and set STG v LTG as appropriate.     Time  1    Period  Weeks    Status  Achieved      PT SHORT TERM GOAL #6   Title  Patient will not "fall" on at least one trial out of three for condition 6 on SOT    Baseline  fell on 3 of 3 trials; 10/30 fell on 3 of 3 trials    Time  4    Period  Weeks    Status  Not Met        PT Long Term Goals - 05/04/17 1251      PT LONG TERM GOAL #1   Title  Patient will be independent with updated HEP for balance (Target all LTGs 06/10/17)    Time  8    Period  Weeks    Status  New      PT LONG TERM GOAL #2   Title  Patient will improve FGA score to >=27/30 to indicate lesser fall risk and improved balance.     Time  8    Period  Weeks    Status  New      PT LONG TERM GOAL #3   Title  Patient will improve Berg score to >= 50/56 to indicate lesser fall risk and improved balance.    Time  8    Period  Weeks    Status  New      PT LONG TERM GOAL #4   Title  Patient will verbalize a plan for continued community based activity upon discharge from PT    Time  8    Period  Weeks    Status  New  PT LONG TERM GOAL #5   Title  Patient will improve his composite score to >=70 on SOT    Time  8    Period  Weeks    Status  New            Plan - 22-May-2017 1156    Clinical Impression Statement  Session focused on balance training on compliant surfaces and EC for incr effectiveness of vestibular system. Patient liked use of walking stick for improved balance when stepping up step with no rail.  Patient continues to progress and is apparently being more consistent with his balance HEP.     Rehab Potential  Good    Clinical Impairments Affecting Rehab Potential  neuropathy bil LEs    PT Frequency  2x / week    PT Duration  8 weeks    PT Treatment/Interventions  ADLs/Self Care Home Management;Functional mobility training;Stair training;Gait training;DME Instruction;Therapeutic activities;Therapeutic exercise;Balance training;Neuromuscular re-education;Patient/family education;Vestibular;Visual/perceptual remediation/compensation    PT Next Visit Plan  Neuropathy-Continue working on dynamic standing balance activities, EC firm surface and foam; LE strengthening.     Consulted and Agree with Plan of Care  Patient       Patient will benefit from skilled therapeutic intervention in order to improve the following deficits and impairments:  Decreased balance, Decreased knowledge of use of DME, Difficulty walking, Impaired perceived functional ability, Impaired sensation, Postural dysfunction  Visit Diagnosis: Other abnormalities of gait and mobility  Muscle weakness (generalized)   G-Codes - 2017-05-22 1245    Functional Assessment Tool Used (Outpatient Only)  Berg Balance 51/56;  FGA 24/30    Functional Limitation  Mobility: Walking and moving around    Mobility: Walking and Moving Around Current Status [phone])  At least 1 percent but less than 20 percent impaired, limited or restricted    Mobility: Walking and Moving Around Goal Status [phone])  At least 1 percent but less than 20 percent impaired, limited or restricted      Physical Therapy Progress Note  Dates of Reporting Period: 04/11/17 to 05/22/2017  Objective Reports of Subjective Statement: Patient feels his balance has improved, but is still occasionally losing his balance  Objective Measurements: see Gcode  Goal Update: NA  Plan: Continue with PT plan  Reason Skilled Services are Required: Continue balance and gait  training to reduce fall risk    Problem List There are no active problems to display for this patient.   Rexanne Mano, PT May 22, 2017, 12:46 PM  [facility] [address] [place], [place], [address] Phone: [phone]   Fax:  [phone]  Name: Derrick Donaldson MRN: [id number] Date of Birth: 11-06-1950

## 2017-05-23 ENCOUNTER — Ambulatory Visit: Payer: Medicare Other | Admitting: Physical Therapy

## 2017-05-25 ENCOUNTER — Ambulatory Visit: Payer: Medicare Other | Admitting: Physical Therapy

## 2017-05-25 ENCOUNTER — Encounter: Payer: Self-pay | Admitting: Physical Therapy

## 2017-05-25 DIAGNOSIS — R29818 Other symptoms and signs involving the nervous system: Secondary | ICD-10-CM | POA: Diagnosis not present

## 2017-05-25 DIAGNOSIS — R2689 Other abnormalities of gait and mobility: Secondary | ICD-10-CM | POA: Diagnosis not present

## 2017-05-25 DIAGNOSIS — M6281 Muscle weakness (generalized): Secondary | ICD-10-CM | POA: Diagnosis not present

## 2017-05-25 NOTE — Patient Instructions (Signed)
Gastroc / Heel Cord Stretch - On Step    Stand with heels over edge of stair. Holding rail, lower heels until stretch is felt in calf of legs. Repeat _1__ times. Do _1-2_ times per day.  HOLD 60 secs  Copyright  VHI. All rights reserved.  Heel Cord Stretch    Place one leg forward, bent, other leg behind and straight. Lean forward keeping back heel flat. Hold __60__ seconds while counting out loud. Repeat with other leg. Repeat _1___ times. Do _1-2___ sessions per day.  HOLD 60 secs  http://gt2.exer.us/511   Copyright  VHI. All rights reserved.

## 2017-05-25 NOTE — Therapy (Signed)
[facility] [address] [place], [place], [address] Phone: [phone]   Fax:  [phone]  Physical Therapy Treatment  Patient Details  Name: Derrick Donaldson MRN: [id number] Date of Birth: October 16, 1950 Referring Provider: Lajean Manes MD   Encounter Date: 05/25/2017  PT End of Session - 05/25/17 1525    Visit Number  11    Number of Visits  17    Date for PT Re-Evaluation  05/11/17    Authorization Type  BCBS    PT Start Time  1104    PT Stop Time  1150    PT Time Calculation (min)  46 min       Past Medical History:  Diagnosis Date  . History of kidney stones     Past Surgical History:  Procedure Laterality Date  . COLONOSCOPY WITH PROPOFOL N/A 09/23/2014   Procedure: COLONOSCOPY WITH PROPOFOL;  Surgeon: [name], MD;  Location: [facility];  Service: Endoscopy;  Laterality: N/A;  . knee arthroscopic surgery    . TONSILLECTOMY      There were no vitals filed for this visit.  Subjective Assessment - 05/25/17 1518    Subjective  Pt states he missed PT on Tues. due to pain in feet due to neuropathy - states these episodes of pain is very sporadic - but last for about 8 hours when they do occur    Pertinent History  Peripheral neuropathy. Lumbar disc disease. Restless leg syndrome, hearing loss R ear    Patient Stated Goals  better balance; easier getting out of the chair; less falls and stumbling; better balance when riding bike (and stopping with clipss)    Currently in Pain?  No/denies                      [facility] Adult PT Treatment/Exercise - 05/25/17 1114      Exercises   Exercises  Knee/Hip;Ankle      Ankle Exercises: Stretches   Gastroc Stretch  1 rep;60 seconds use of 3" step in standing - each leg separately    Other Stretch  Runners stretch  60 sec hold RLE and LLE      Ankle Exercises: Standing   Heel Raises  10 reps bil. UE support on counter    Toe Raise  Other  (comment) attempted in standing - but unable       Ankle Exercises: Seated   Other Seated Ankle Exercises  seated plantarflexion with blue theraband x 10 reps each leg;  seated dorsiflexion with blue theraband in seated x 10 reps eahc leg          Balance Exercises - 05/25/17 1522      Balance Exercises: Standing   Standing Eyes Opened  Narrow base of support (BOS);Wide (BOA);Foam/compliant surface;Head turns;5 reps    Standing Eyes Closed  Narrow base of support (BOS);Wide (BOA);Head turns;Foam/compliant surface;5 reps    Other Standing Exercises  Pt performed touching balance bubbles (3) in various order with minimal UE support on counter prn:  cone taps with each foot - progresing to tipping over and standin upright;  stepping over and back of orange hurdle 5 reps each leg with UE support on counter prn;  amb. on tip toes along counter top 2 reps ; attempted amb. on heels but pt unable to do due to dorsiflexor weakness          PT Short Term Goals - 05/09/17 1758  PT SHORT TERM GOAL #1   Title  Patient will be independent with basic HEP to address balance deficits. (Target date all STGs: 05/11/17)    Time  4    Period  Weeks    Status  Achieved      PT SHORT TERM GOAL #2   Title  Patient will improve FGA score to >=24/30 to indicate lesser fall risk and improved balance..    Baseline  22/30; 10/30 24/30    Time  4    Period  Weeks    Status  Achieved      PT SHORT TERM GOAL #3   Title  Patient will improve Berg Balance score to >=45/56 to indicate lesser fall risk and improved balance.    Baseline  39/56; 10/30 51/56    Time  4    Period  Weeks    Status  Achieved      PT SHORT TERM GOAL #4   Title  Patient will verbalize understanding of fall prevention strategies for his home environment.     Baseline  reports he was given fall prevention "checklist" to review for his home and his home was already in good shape    Time  1    Period  Weeks    Status  Achieved       PT SHORT TERM GOAL #5   Title  Complete SOT via Balancemaster and set STG v LTG as appropriate.     Time  1    Period  Weeks    Status  Achieved      PT SHORT TERM GOAL #6   Title  Patient will not "fall" on at least one trial out of three for condition 6 on SOT    Baseline  fell on 3 of 3 trials; 10/30 fell on 3 of 3 trials    Time  4    Period  Weeks    Status  Not Met        PT Long Term Goals - 05/04/17 1251      PT LONG TERM GOAL #1   Title  Patient will be independent with updated HEP for balance (Target all LTGs 06/10/17)    Time  8    Period  Weeks    Status  New      PT LONG TERM GOAL #2   Title  Patient will improve FGA score to >=27/30 to indicate lesser fall risk and improved balance.     Time  8    Period  Weeks    Status  New      PT LONG TERM GOAL #3   Title  Patient will improve Berg score to >= 50/56 to indicate lesser fall risk and improved balance.    Time  8    Period  Weeks    Status  New      PT LONG TERM GOAL #4   Title  Patient will verbalize a plan for continued community based activity upon discharge from PT    Time  8    Period  Weeks    Status  New      PT LONG TERM GOAL #5   Title  Patient will improve his composite score to >=70 on SOT    Time  8    Period  Weeks    Status  New            Plan - 05/25/17 1526    Clinical Impression  Statement  Pt demonstrates decr. SLS on bil. LE's and also demonstrates decr. vestibular input as evidenced by significant LOB standing on compliant surface with EC;  pt has significant weakness in bil. dorsiflexors and plantarflexors    Rehab Potential  Good    Clinical Impairments Affecting Rehab Potential  neuropathy bil LEs    PT Frequency  2x / week    PT Duration  8 weeks    PT Treatment/Interventions  ADLs/Self Care Home Management;Functional mobility training;Stair training;Gait training;DME Instruction;Therapeutic activities;Therapeutic exercise;Balance training;Neuromuscular  re-education;Patient/family education;Vestibular;Visual/perceptual remediation/compensation    PT Next Visit Plan  Neuropathy-Continue working on dynamic standing balance activities, EC firm surface and foam; LE strengthening.     PT Home Exercise Plan  added runner's stretch and heel cord stretches to HEP    Consulted and Agree with Plan of Care  Patient       Patient will benefit from skilled therapeutic intervention in order to improve the following deficits and impairments:  Decreased balance, Decreased knowledge of use of DME, Difficulty walking, Impaired perceived functional ability, Impaired sensation, Postural dysfunction  Visit Diagnosis: Other abnormalities of gait and mobility  Muscle weakness (generalized)     Problem List There are no active problems to display for this patient.   Alda Lea, PT 05/26/2017, 7:51 AM  [facility] [address] [place], [place], [address] Phone: [phone]   Fax:  [phone]  Name: Derrick Donaldson MRN: [id number] Date of Birth: January 19, 1951

## 2017-05-30 ENCOUNTER — Encounter: Payer: Self-pay | Admitting: Physical Therapy

## 2017-05-30 ENCOUNTER — Ambulatory Visit: Payer: Medicare Other | Admitting: Physical Therapy

## 2017-05-30 DIAGNOSIS — R29818 Other symptoms and signs involving the nervous system: Secondary | ICD-10-CM | POA: Diagnosis not present

## 2017-05-30 DIAGNOSIS — R2689 Other abnormalities of gait and mobility: Secondary | ICD-10-CM | POA: Diagnosis not present

## 2017-05-30 DIAGNOSIS — M6281 Muscle weakness (generalized): Secondary | ICD-10-CM | POA: Diagnosis not present

## 2017-05-30 NOTE — Therapy (Signed)
[facility] [address] [place], [place], [address] Phone: [phone]   Fax:  [phone]  Physical Therapy Treatment  Patient Details  Name: Derrick Donaldson MRN: [id number] Date of Birth: 01-31-1951 Referring Provider: Lajean Manes MD   Encounter Date: 05/30/2017  PT End of Session - 05/30/17 1253    Visit Number  12    Number of Visits  17    Date for PT Re-Evaluation  05/11/17    Authorization Type  05/30/17 pt reports primary insurance is Medicare and BCBS is secondary (not entered in EMR this way)    PT Start Time  1101    PT Stop Time  1145    PT Time Calculation (min)  44 min    Equipment Utilized During Treatment  Gait belt    Activity Tolerance  Patient tolerated treatment well    Behavior During Therapy  [facility] for tasks assessed/performed       Past Medical History:  Diagnosis Date  . History of kidney stones     Past Surgical History:  Procedure Laterality Date  . COLONOSCOPY WITH PROPOFOL N/A 09/23/2014   Procedure: COLONOSCOPY WITH PROPOFOL;  Surgeon: [name], MD;  Location: [facility];  Service: Endoscopy;  Laterality: N/A;  . knee arthroscopic surgery    . TONSILLECTOMY      There were no vitals filed for this visit.  Subjective Assessment - 05/30/17 1101    Subjective  Not sleeping well due to recent cold. Stumbled a bit this morning and caught himself against the wall.     Pertinent History  Peripheral neuropathy. Lumbar disc disease. Restless leg syndrome, hearing loss R ear    Limitations  Standing;Walking    How long can you stand comfortably?  15 minutes    How long can you walk comfortably?  30 minutes    Patient Stated Goals  better balance; easier getting out of the chair; less falls and stumbling; better balance when riding bike (and stopping with clipss)    Currently in Pain?  No/denies                      [facility] Adult PT Treatment/Exercise -  05/30/17 0001      Transfers   Sit to Stand  4: Min guard feet on blue airex cushion; LOB's posteriorly           Balance Exercises - 05/30/17 1240      Balance Exercises: Standing   Standing Eyes Opened  Narrow base of support (BOS);Foam/compliant surface;30 secs    Standing Eyes Closed  Narrow base of support (BOS);Foam/compliant surface;1 rep;30 secs    SLS  Eyes open;Foam/compliant surface;Upper extremity support 1 and on smallest rockerboard    Stepping Strategy  Anterior;Posterior;Foam/compliant surface;10 reps on black beam on red mat to elicit LOB    Rockerboard  Anterior/posterior;EO;EC bil PF strengthening;     Balance Beam  blue beam feet apart with great difficulty <3 sec multiple attempts; single finger support with 30 sec    Gait with Head Turns  Forward;Cognitive challenge scanning looking for cards; req'd 4 trips to find all; no LO    Sidestepping  Foam/compliant support;5 reps blue beam    Step Over Hurdles / Cones  forward and backwards (incr difficulty due to PF weakness)    Other Standing Exercises  "rock hopping" with targets initially on red mat (too difficult to complete 3 large targets); on solid floor up  to 6 targets with min to minguard assist        PT Education - 05/30/17 1252    Education provided  Yes    Education Details  begin planning for discharge from PT by looking into continuing exercise in community-based program (pt to look into [facility])    Person(s) Educated  Patient    Methods  Explanation    Comprehension  Verbalized understanding;Need further instruction       PT Short Term Goals - 05/09/17 1758      PT SHORT TERM GOAL #1   Title  Patient will be independent with basic HEP to address balance deficits. (Target date all STGs: 05/11/17)    Time  4    Period  Weeks    Status  Achieved      PT SHORT TERM GOAL #2   Title  Patient will improve FGA score to >=24/30 to indicate lesser fall risk and improved balance..    Baseline  22/30;  10/30 24/30    Time  4    Period  Weeks    Status  Achieved      PT SHORT TERM GOAL #3   Title  Patient will improve Berg Balance score to >=45/56 to indicate lesser fall risk and improved balance.    Baseline  39/56; 10/30 51/56    Time  4    Period  Weeks    Status  Achieved      PT SHORT TERM GOAL #4   Title  Patient will verbalize understanding of fall prevention strategies for his home environment.     Baseline  reports he was given fall prevention "checklist" to review for his home and his home was already in good shape    Time  1    Period  Weeks    Status  Achieved      PT SHORT TERM GOAL #5   Title  Complete SOT via Balancemaster and set STG v LTG as appropriate.     Time  1    Period  Weeks    Status  Achieved      PT SHORT TERM GOAL #6   Title  Patient will not "fall" on at least one trial out of three for condition 6 on SOT    Baseline  fell on 3 of 3 trials; 10/30 fell on 3 of 3 trials    Time  4    Period  Weeks    Status  Not Met        PT Long Term Goals - 05/04/17 1251      PT LONG TERM GOAL #1   Title  Patient will be independent with updated HEP for balance (Target all LTGs 06/10/17)    Time  8    Period  Weeks    Status  New      PT LONG TERM GOAL #2   Title  Patient will improve FGA score to >=27/30 to indicate lesser fall risk and improved balance.     Time  8    Period  Weeks    Status  New      PT LONG TERM GOAL #3   Title  Patient will improve Berg score to >= 50/56 to indicate lesser fall risk and improved balance.    Time  8    Period  Weeks    Status  New      PT LONG TERM GOAL #4   Title  Patient will  verbalize a plan for continued community based activity upon discharge from PT    Time  8    Period  Weeks    Status  New      PT LONG TERM GOAL #5   Title  Patient will improve his composite score to >=70 on SOT    Time  8    Period  Weeks    Status  New            Plan - 05/30/17 1254    Clinical Impression  Statement  Session focused on balance and vestibular training--pt has improved ability to stand on foam and maintain balance with EC despite severe neuropathy. Began discussing discharge plan and pt agrees he needs to look into the [facility] program he has been meaning to look into. Patient can benefit from continued therapy to finalize/upgrade his HEP.     Rehab Potential  Good    Clinical Impairments Affecting Rehab Potential  neuropathy bil LEs    PT Frequency  2x / week    PT Duration  8 weeks    PT Treatment/Interventions  ADLs/Self Care Home Management;Functional mobility training;Stair training;Gait training;DME Instruction;Therapeutic activities;Therapeutic exercise;Balance training;Neuromuscular re-education;Patient/family education;Vestibular;Visual/perceptual remediation/compensation    PT Next Visit Plan  begin checking LTGs (?d/c 35/57 so no re-cert); Neuropathy-Continue working on dynamic standing balance activities, EC firm surface and foam; LE strengthening.     PT Home Exercise Plan  added runner's stretch and heel cord stretches to HEP    Consulted and Agree with Plan of Care  Patient       Patient will benefit from skilled therapeutic intervention in order to improve the following deficits and impairments:  Decreased balance, Decreased knowledge of use of DME, Difficulty walking, Impaired perceived functional ability, Impaired sensation, Postural dysfunction  Visit Diagnosis: Other abnormalities of gait and mobility  Muscle weakness (generalized)  Other symptoms and signs involving the nervous system     Problem List There are no active problems to display for this patient.   Rexanne Mano, PT 05/30/2017, 12:58 PM  [facility] [address] [place], [place], [address] Phone: [phone]   Fax:  [phone]  Name: Derrick Donaldson MRN: [id number] Date of Birth: 10-Feb-1951

## 2017-06-06 ENCOUNTER — Encounter: Payer: Self-pay | Admitting: Physical Therapy

## 2017-06-06 ENCOUNTER — Ambulatory Visit: Payer: Medicare Other | Admitting: Physical Therapy

## 2017-06-06 DIAGNOSIS — M6281 Muscle weakness (generalized): Secondary | ICD-10-CM

## 2017-06-06 DIAGNOSIS — R2689 Other abnormalities of gait and mobility: Secondary | ICD-10-CM

## 2017-06-06 DIAGNOSIS — R29818 Other symptoms and signs involving the nervous system: Secondary | ICD-10-CM | POA: Diagnosis not present

## 2017-06-06 NOTE — Therapy (Addendum)
[facility] [address] [place], [place], [address] Phone: [phone]   Fax:  [phone]  Physical Therapy Treatment and Discharge Summary  Patient Details  Name: Derrick Donaldson MRN: [id number] Date of Birth: 10-10-1950 Referring Provider: Lajean Manes MD   Encounter Date: 06/06/2017  PT End of Session - 06/06/17 2020    Visit Number  13    Number of Visits  17    Authorization Type  05/30/17 pt reports primary insurance is Medicare and BCBS is secondary (not entered in EMR this way)    PT Start Time  1101    PT Stop Time  1135 session ended early due to D/C    PT Time Calculation (min)  34 min       Past Medical History:  Diagnosis Date  . History of kidney stones     Past Surgical History:  Procedure Laterality Date  . COLONOSCOPY WITH PROPOFOL N/A 09/23/2014   Procedure: COLONOSCOPY WITH PROPOFOL;  Surgeon: [name], MD;  Location: [facility];  Service: Endoscopy;  Laterality: N/A;  . knee arthroscopic surgery    . TONSILLECTOMY      There were no vitals filed for this visit.  Subjective Assessment - 06/06/17 2019    Subjective  Pt states that "today needs to be my last day"    Pertinent History  Peripheral neuropathy. Lumbar disc disease. Restless leg syndrome, hearing loss R ear    Patient Stated Goals  better balance; easier getting out of the chair; less falls and stumbling; better balance when riding bike (and stopping with clipss)    Currently in Pain?  No/denies         [facility] PT Assessment - 06/06/17 0001      Berg Balance Test   Sit to Stand  Able to stand without using hands and stabilize independently    Standing Unsupported  Able to stand safely 2 minutes    Sitting with Back Unsupported but Feet Supported on Floor or Stool  Able to sit safely and securely 2 minutes    Stand to Sit  Sits safely with minimal use of hands    Transfers  Able to transfer safely, minor use of  hands    Standing Unsupported with Eyes Closed  Able to stand 10 seconds safely    Standing Ubsupported with Feet Together  Able to place feet together independently and stand 1 minute safely    From Standing, Reach Forward with Outstretched Arm  Can reach confidently >25 cm (10")    From Standing Position, Pick up Object from Floor  Able to pick up shoe safely and easily    From Standing Position, Turn to Look Behind Over each Shoulder  Looks behind from both sides and weight shifts well    Turn 360 Degrees  Able to turn 360 degrees safely in 4 seconds or less    Standing Unsupported, Alternately Place Feet on Step/Stool  Able to stand independently and safely and complete 8 steps in 20 seconds    Standing Unsupported, One Foot in Front  Able to plae foot ahead of the other independently and hold 30 seconds    Standing on One Leg  Tries to lift leg/unable to hold 3 seconds but remains standing independently    Total Score  52        Sensory Organization Test - composite score 72/100 (WNL's)  Somatosensory input - slightly below normal Visual and vestibular inputs WNL's  Condition 1 - all 3 trials WNL's Condition 2 - all 3 trials slightly below N Condition 3 - all 3 trials WNL's Condition 4 - trial 1 slightly below N:  Trials 2 and 3 WNL's Condition 5 - all 3 trials WNL's Condition 6- trial 1 FALL:  Trial 2 WNL's:  Trial 3 slightly below N                 PT Short Term Goals - 05/09/17 1758      PT SHORT TERM GOAL #1   Title  Patient will be independent with basic HEP to address balance deficits. (Target date all STGs: 05/11/17)    Time  4    Period  Weeks    Status  Achieved      PT SHORT TERM GOAL #2   Title  Patient will improve FGA score to >=24/30 to indicate lesser fall risk and improved balance..    Baseline  22/30; 10/30 24/30    Time  4    Period  Weeks    Status  Achieved      PT SHORT TERM GOAL #3   Title  Patient will improve Berg Balance score to  >=45/56 to indicate lesser fall risk and improved balance.    Baseline  39/56; 10/30 51/56    Time  4    Period  Weeks    Status  Achieved      PT SHORT TERM GOAL #4   Title  Patient will verbalize understanding of fall prevention strategies for his home environment.     Baseline  reports he was given fall prevention "checklist" to review for his home and his home was already in good shape    Time  1    Period  Weeks    Status  Achieved      PT SHORT TERM GOAL #5   Title  Complete SOT via Balancemaster and set STG v LTG as appropriate.     Time  1    Period  Weeks    Status  Achieved      PT SHORT TERM GOAL #6   Title  Patient will not "fall" on at least one trial out of three for condition 6 on SOT    Baseline  fell on 3 of 3 trials; 10/30 fell on 3 of 3 trials    Time  4    Period  Weeks    Status  Not Met        PT Long Term Goals - 06/06/17 2022      PT LONG TERM GOAL #1   Title  Patient will be independent with updated HEP for balance (Target all LTGs 06/10/17)    Status  Achieved      PT LONG TERM GOAL #2   Title  Patient will improve FGA score to >=27/30 to indicate lesser fall risk and improved balance.     Status  Deferred      PT LONG TERM GOAL #3   Title  Patient will improve Berg score to >= 50/56 to indicate lesser fall risk and improved balance.    Baseline  score 52/56    Status  Achieved      PT LONG TERM GOAL #4   Title  Patient will verbalize a plan for continued community based activity upon discharge from PT    Status  Achieved      PT LONG TERM GOAL #5   Title  Patient will  improve his composite score to >=70 on SOT    Baseline  composite score on SOT 72/100 -- 06-06-17    Status  Achieved            Plan - 06/06/17 2024    Clinical Impression Statement  Pt requesting D/C from PT today due to financial concerns with billing stateement received.  Pt has met LTG's # 1, 3, 4, and 5 with LTG #2 deferred due to time constraint; pt states he  has no problems with HEP and is planning on continuing exercise program at [facility] rec center.      PT Frequency  2x / week    PT Duration  8 weeks    PT Treatment/Interventions  ADLs/Self Care Home Management;Functional mobility training;Stair training;Gait training;DME Instruction;Therapeutic activities;Therapeutic exercise;Balance training;Neuromuscular re-education;Patient/family education;Vestibular;Visual/perceptual remediation/compensation    PT Next Visit Plan  D/C    Consulted and Agree with Plan of Care  Patient       Patient will benefit from skilled therapeutic intervention in order to improve the following deficits and impairments:  Decreased balance, Decreased knowledge of use of DME, Difficulty walking, Impaired perceived functional ability, Impaired sensation, Postural dysfunction  Visit Diagnosis: Other abnormalities of gait and mobility  Muscle weakness (generalized)     Problem List There are no active problems to display for this patient.   PHYSICAL THERAPY DISCHARGE SUMMARY  Visits from Start of Care: 13  Current functional level related to goals / functional outcomes: See above for progress towards goals   Remaining deficits: Continued decr. High level balance skills   Education / Equipment: HEP for Balance and LE strengthening and stretching exercises Plan: Patient agrees to discharge.  Patient goals were partially met. Patient is being discharged due to the patient's request.  ?????         Alda Lea, PT 06/06/2017, 8:31 PM  Addended for G-code:    Barry Brunner, PT 06/13/17   [facility] [address] [place], [place], [address] Phone: [phone]   Fax:  [phone]  Name: Derrick Donaldson MRN: [id number] Date of Birth: 06-02-1951

## 2017-06-08 ENCOUNTER — Encounter: Payer: BC Managed Care – PPO | Admitting: Physical Therapy

## 2017-06-13 ENCOUNTER — Encounter: Payer: BC Managed Care – PPO | Admitting: Physical Therapy

## 2017-06-15 ENCOUNTER — Encounter: Payer: BC Managed Care – PPO | Admitting: Physical Therapy

## 2017-06-20 ENCOUNTER — Encounter: Payer: BC Managed Care – PPO | Admitting: Physical Therapy

## 2017-06-22 ENCOUNTER — Encounter: Payer: BC Managed Care – PPO | Admitting: Physical Therapy

## 2017-07-13 ENCOUNTER — Encounter: Payer: Self-pay | Admitting: Podiatry

## 2017-07-13 ENCOUNTER — Ambulatory Visit (INDEPENDENT_AMBULATORY_CARE_PROVIDER_SITE_OTHER): Payer: Medicare Other | Admitting: Podiatry

## 2017-07-13 VITALS — BP 116/65 | HR 69 | Resp 18

## 2017-07-13 DIAGNOSIS — R0989 Other specified symptoms and signs involving the circulatory and respiratory systems: Secondary | ICD-10-CM

## 2017-07-13 DIAGNOSIS — L600 Ingrowing nail: Secondary | ICD-10-CM | POA: Diagnosis not present

## 2017-07-13 DIAGNOSIS — B351 Tinea unguium: Secondary | ICD-10-CM

## 2017-07-13 NOTE — Progress Notes (Signed)
Subjective:    Patient ID: Derrick Donaldson, male    DOB: 08/18/1950, 67 y.o.   MRN: [id number]  HPI  67 year old male presents the office today for concerns of ingrown toenail of the right big toe which is been ongoing for about 2 weeks.  He states that area was very tender and red around the toenail but denies seeing any drainage or pus.  He has gotten somewhat better.  He previously had a partial nail avulsion the left big toe he did well from this and he would like to proceed with this on the right side.  He has no other concerns or questions today.  He denies any numbness or tingling to his feet.  Denies any claudication symptoms.  He has no other concerns.   Review of Systems  All other systems reviewed and are negative.  Past Medical History:  Diagnosis Date  . History of kidney stones     Past Surgical History:  Procedure Laterality Date  . COLONOSCOPY WITH PROPOFOL N/A 09/23/2014   Procedure: COLONOSCOPY WITH PROPOFOL;  Surgeon: [name], MD;  Location: [facility];  Service: Endoscopy;  Laterality: N/A;  . knee arthroscopic surgery    . TONSILLECTOMY       Current Outpatient Medications:  .  aspirin EC 81 MG tablet, Take 81 mg by mouth daily., Disp: , Rfl:  .  fluticasone (FLONASE) 50 MCG/ACT nasal spray, Place 1 spray into the nose daily as needed for allergies., Disp: , Rfl:  .  gabapentin (NEURONTIN) 300 MG capsule, Take 300 mg by mouth as needed., Disp: , Rfl:  .  loratadine (CLARITIN) 10 MG tablet, Take 10 mg by mouth daily., Disp: , Rfl:  .  Omega-3 Fatty Acids (FISH OIL) 1000 MG CAPS, Take 1,000 mg by mouth daily., Disp: , Rfl:  .  pseudoephedrine (SUDAFED) 30 MG tablet, Take 30 mg by mouth every 4 (four) hours as needed for congestion., Disp: , Rfl:  .  tamsulosin (FLOMAX) 0.4 MG CAPS capsule, Take 0.4 mg by mouth daily., Disp: , Rfl: 3  Not on File  Social History   Socioeconomic History  . Marital status: Married    Spouse name: Not on file  .  Number of children: Not on file  . Years of education: Not on file  . Highest education level: Not on file  Social Needs  . Financial resource strain: Not on file  . Food insecurity - worry: Not on file  . Food insecurity - inability: Not on file  . Transportation needs - medical: Not on file  . Transportation needs - non-medical: Not on file  Occupational History  . Not on file  Tobacco Use  . Smoking status: Never Smoker  . Smokeless tobacco: Never Used  Substance and Sexual Activity  . Alcohol use: Yes    Comment: wine nightly  . Drug use: Not on file  . Sexual activity: Not on file  Other Topics Concern  . Not on file  Social History Narrative  . Not on file        Objective:   Physical Exam General: AAO x3, NAD  Dermatological: Nails in general are hypertrophic, dystrophic with yellow-brown discoloration.  On the right hallux toenail there is incurvation of both the medial lateral aspects there is tenderness palpation of the nail corners.  There is localized edema and faint erythema but there is no ascending sialitis or warmth.  There is no drainage or pus.  No malodor.  No ascending cellulitis.  No other open lesions or pre-ulcerative lesion identified today.  Vascular: Dorsalis Pedis artery and Posterior Tibial artery pedal pulses are 2/4 bilateral except for the right DP was mildly decreased compared to the other pulses.  There is an immedate capillary fill time. Pedal hair growth present. No varicosities and no lower extremity edema present bilateral. There is no pain with calf compression, swelling, warmth, erythema.  Denies any claudication symptoms  Neruologic: Grossly intact via light touch bilateral. Protective threshold with Semmes Wienstein monofilament intact to all pedal sites bilateral.   Musculoskeletal: No gross boney pedal deformities bilateral. No pain, crepitus, or limitation noted with foot and ankle range of motion bilateral. Muscular strength 5/5 in all  groups tested bilateral.  Gait: Unassisted, Nonantalgic.     Assessment & Plan:  68 year old male right hallux ingrown toenail, onychomycosis -Treatment options discussed including all alternatives, risks, and complications -Etiology of symptoms were discussed -Discussed nail fungus treatment.  He was previously on Lamisil and it did help with the fungus came back.  After the procedure, see below I did send the nails for culture.  Discussed restarting Lamisil or other medications but we will await the results of the culture before proceeding with treatment for this. -At this time, the patient is requesting partial nail removal with chemical matricectomy to the symptomatic portion of the nail.  I did an ABI to ensure healing and was normal.  On the left is 1.2 right 1.18.  Risks and complications were discussed with the patient for which they understand and written consent was obtained for the procedure. Under sterile conditions a total of 3 mL of a mixture of 2% lidocaine plain and 0.5% Marcaine plain was infiltrated in a hallux block fashion. Once anesthetized, the skin was prepped in sterile fashion. A tourniquet was then applied. Next the medial and lateral aspect of hallux nail border was then sharply excised making sure to remove the entire offending nail border. Once the nails were ensured to be removed area was debrided and the underlying skin was intact. There is no purulence identified in the procedure. Next phenol was then applied under standard conditions and copiously irrigated. Silvadene was applied. A dry sterile dressing was applied. After application of the dressing the tourniquet was removed and there is found to be an immediate capillary refill time to the digit. The patient tolerated the procedure well any complications. Post procedure instructions were discussed the patient for which he verbally understood. Follow-up in one week for nail check or sooner if any problems are to arise.  Discussed signs/symptoms of infection and directed to call the office immediately should any occur or go directly to the emergency room. In the meantime, encouraged to call the office with any questions, concerns, changes symptoms.  [name] DPM

## 2017-07-13 NOTE — Patient Instructions (Signed)

## 2017-07-20 ENCOUNTER — Ambulatory Visit (INDEPENDENT_AMBULATORY_CARE_PROVIDER_SITE_OTHER): Payer: Medicare Other | Admitting: Podiatry

## 2017-07-20 ENCOUNTER — Encounter: Payer: Self-pay | Admitting: Podiatry

## 2017-07-20 DIAGNOSIS — R319 Hematuria, unspecified: Secondary | ICD-10-CM | POA: Diagnosis not present

## 2017-07-20 DIAGNOSIS — R972 Elevated prostate specific antigen [PSA]: Secondary | ICD-10-CM | POA: Diagnosis not present

## 2017-07-20 DIAGNOSIS — Z79899 Other long term (current) drug therapy: Secondary | ICD-10-CM | POA: Diagnosis not present

## 2017-07-20 DIAGNOSIS — L600 Ingrowing nail: Secondary | ICD-10-CM

## 2017-07-20 DIAGNOSIS — B351 Tinea unguium: Secondary | ICD-10-CM

## 2017-07-20 DIAGNOSIS — Z0000 Encounter for general adult medical examination without abnormal findings: Secondary | ICD-10-CM | POA: Diagnosis not present

## 2017-07-20 LAB — HEPATIC FUNCTION PANEL
AG Ratio: 1.5 (calc) (ref 1.0–2.5)
ALKALINE PHOSPHATASE (APISO): 58 U/L (ref 40–115)
ALT: 30 U/L (ref 9–46)
AST: 19 U/L (ref 10–35)
Albumin: 4.1 g/dL (ref 3.6–5.1)
BILIRUBIN TOTAL: 0.6 mg/dL (ref 0.2–1.2)
Bilirubin, Direct: 0.1 mg/dL (ref 0.0–0.2)
Globulin: 2.7 g/dL (calc) (ref 1.9–3.7)
Indirect Bilirubin: 0.5 mg/dL (calc) (ref 0.2–1.2)
Total Protein: 6.8 g/dL (ref 6.1–8.1)

## 2017-07-20 LAB — CBC WITH DIFFERENTIAL/PLATELET
Basophils Absolute: 31 cells/uL (ref 0–200)
Basophils Relative: 0.6 %
Eosinophils Absolute: 173 cells/uL (ref 15–500)
Eosinophils Relative: 3.4 %
HEMATOCRIT: 47.6 % (ref 38.5–50.0)
HEMOGLOBIN: 16.1 g/dL (ref 13.2–17.1)
LYMPHS ABS: 1647 {cells}/uL (ref 850–3900)
MCH: 30.5 pg (ref 27.0–33.0)
MCHC: 33.8 g/dL (ref 32.0–36.0)
MCV: 90.2 fL (ref 80.0–100.0)
MPV: 10.3 fL (ref 7.5–12.5)
Monocytes Relative: 8.7 %
NEUTROS ABS: 2805 {cells}/uL (ref 1500–7800)
NEUTROS PCT: 55 %
Platelets: 236 10*3/uL (ref 140–400)
RBC: 5.28 10*6/uL (ref 4.20–5.80)
RDW: 12.8 % (ref 11.0–15.0)
Total Lymphocyte: 32.3 %
WBC mixed population: 444 cells/uL (ref 200–950)
WBC: 5.1 10*3/uL (ref 3.8–10.8)

## 2017-07-20 MED ORDER — TERBINAFINE HCL 250 MG PO TABS: 250.0000 mg | ORAL_TABLET | Freq: Every day | ORAL | 0 refills | Status: DC

## 2017-07-23 NOTE — Progress Notes (Signed)
Subjective: Derrick Donaldson is a 67 y.o.  male returns to office today for follow up evaluation after having right Hallux partial nail avulsion performed.  At first he was soaking in Epsom salts, antibiotic ointment and a bandage but he has since stopped he will do this for the first couple of days.  He states that the area is doing well he has no drainage or pus and no pain to the area.  He denies any systemic complaints including fevers, chills, nausea, vomiting.  Objective:  Vitals: Reviewed  General: Well developed, nourished, in no acute distress, alert and oriented x3   Dermatology: Skin is warm, dry and supple bilateral. Right hallux nail border appears to be clean, dry, with mild granular tissue and surrounding scab.  There is minimal erythema gently on the nail corners but there is no ascending cellulitis or increase in warmth.  There is no drainage or pus identified.  The remaining nails appear unremarkable at this time. There are no other lesions or other signs of infection present.  Neurovascular status: Intact. No lower extremity swelling; No pain with calf compression bilateral.  Musculoskeletal: Decreased tenderness to palpation of the medial and lateral hallux nail fold(s). Muscular strength within normal limits bilateral.   Assesement and Plan: S/p partial nail avulsion, doing well.   -Continue soaking in epsom salts twice a day followed by antibiotic ointment and a band-aid. Can leave uncovered at night. Continue this until completely healed.  The erythema worsens to call the office we will does not completely resolve the next 2 weeks to follow-up. -I discussed the nail culture results today which did reveal onychomycosis.  We discussed treatment options and after discussion elected to proceed with oral therapy.  We discussed potential side effects of medication as well as success rates.  He understands this and wishes to proceed.  I wanted to order the medication today but I  also will check CBC as well as liver function before starting the medication.  He is not to start the medications will call him with the results of the blood work and he verbally understood this. -If the area has not healed in 2 weeks, call the office for follow-up appointment, or sooner if any problems arise.  Otherwise I will see him back in 6 weeks to follow-up with the nail fungus. -Monitor for any signs/symptoms of infection. Call the office immediately if any occur or go directly to the emergency room. Call with any questions/concerns.  Celesta Gentile, DPM

## 2017-07-24 ENCOUNTER — Telehealth: Payer: Self-pay | Admitting: *Deleted

## 2017-07-24 NOTE — Telephone Encounter (Signed)
Left message informing pt Dr. Leigh Aurora review of result and orders.

## 2017-07-24 NOTE — Telephone Encounter (Deleted)
-----   Message from [name], DPM sent at 07/21/2017  1:30 PM EST ----- Blood work is normal; can start Lamisil. Please let him know.

## 2017-07-24 NOTE — Telephone Encounter (Signed)
-----   Message from [name], DPM sent at 07/21/2017  1:30 PM EST ----- Blood work is normal; can start Lamisil. Please let him know.

## 2017-08-10 ENCOUNTER — Other Ambulatory Visit: Payer: Self-pay | Admitting: Urology

## 2017-08-25 NOTE — Patient Instructions (Addendum)
JOUD INGWERSEN  08/25/2017   Your procedure is scheduled on: 09-05-17   Report to [facility] Main  Entrance Report to Admitting at 8:00 AM   Call this number if you have problems the morning of surgery [phone]   Remember: Do not eat food or drink liquids :After Midnight.      Take these medicines the morning of surgery with A SIP OF WATER: Claritin (Loratadine)                                 You may not have any metal on your body including hair pins and              piercings  Do not wear jewelry, lotions, powders or deodorant             Men may shave face and neck.   Do not bring valuables to the hospital. [place].  Contacts, dentures or bridgework may not be worn into surgery.  Leave suitcase in the car. After surgery it may be brought to your room.     Please read over the following fact sheets you were given: _____________________________________________________________________         [facility] - Preparing for Surgery Before surgery, you can play an important role.  Because skin is not sterile, your skin needs to be as free of germs as possible.  You can reduce the number of germs on your skin by washing with CHG (chlorahexidine gluconate) soap before surgery.  CHG is an antiseptic cleaner which kills germs and bonds with the skin to continue killing germs even after washing. Please DO NOT use if you have an allergy to CHG or antibacterial soaps.  If your skin becomes reddened/irritated stop using the CHG and inform your nurse when you arrive at Short Stay. Do not shave (including legs and underarms) for at least 48 hours prior to the first CHG shower.  You may shave your face/neck. Please follow these instructions carefully:  1.  Shower with CHG Soap the night before surgery and the  morning of Surgery.  2.  If you choose to wash your hair, wash your hair first as usual with your  normal   shampoo.  3.  After you shampoo, rinse your hair and body thoroughly to remove the  shampoo.                           4.  Use CHG as you would any other liquid soap.  You can apply chg directly  to the skin and wash                       Gently with a scrungie or clean washcloth.  5.  Apply the CHG Soap to your body ONLY FROM THE NECK DOWN.   Do not use on face/ open                           Wound or open sores. Avoid contact with eyes, ears mouth and genitals (private parts).  Wash face,  Genitals (private parts) with your normal soap.             6.  Wash thoroughly, paying special attention to the area where your surgery  will be performed.  7.  Thoroughly rinse your body with warm water from the neck down.  8.  DO NOT shower/wash with your normal soap after using and rinsing off  the CHG Soap.                9.  Pat yourself dry with a clean towel.            10.  Wear clean pajamas.            11.  Place clean sheets on your bed the night of your first shower and do not  sleep with pets. Day of Surgery : Do not apply any lotions/deodorants the morning of surgery.  Please wear clean clothes to the hospital/surgery center.  FAILURE TO FOLLOW THESE INSTRUCTIONS MAY RESULT IN THE CANCELLATION OF YOUR SURGERY PATIENT SIGNATURE_________________________________  NURSE SIGNATURE__________________________________  ________________________________________________________________________

## 2017-08-28 ENCOUNTER — Encounter (INDEPENDENT_AMBULATORY_CARE_PROVIDER_SITE_OTHER): Payer: Self-pay

## 2017-08-28 ENCOUNTER — Encounter (HOSPITAL_COMMUNITY): Payer: Self-pay

## 2017-08-28 ENCOUNTER — Other Ambulatory Visit: Payer: Self-pay

## 2017-08-28 ENCOUNTER — Encounter (HOSPITAL_COMMUNITY)
Admission: RE | Admit: 2017-08-28 | Discharge: 2017-08-28 | Disposition: A | Discharge status: Home / Self Care | Payer: Medicare Other | Source: Ambulatory Visit | Attending: Urology | Admitting: Urology

## 2017-08-28 DIAGNOSIS — N210 Calculus in bladder: Secondary | ICD-10-CM | POA: Insufficient documentation

## 2017-08-28 DIAGNOSIS — N400 Enlarged prostate without lower urinary tract symptoms: Secondary | ICD-10-CM | POA: Insufficient documentation

## 2017-08-28 DIAGNOSIS — Z01812 Encounter for preprocedural laboratory examination: Secondary | ICD-10-CM | POA: Diagnosis not present

## 2017-08-28 LAB — BASIC METABOLIC PANEL
Anion gap: 9 (ref 5–15)
BUN: 15 mg/dL (ref 6–20)
CALCIUM: 9.4 mg/dL (ref 8.9–10.3)
CO2: 25 mmol/L (ref 22–32)
CREATININE: 0.93 mg/dL (ref 0.61–1.24)
Chloride: 104 mmol/L (ref 101–111)
Glucose, Bld: 107 mg/dL — ABNORMAL HIGH (ref 65–99)
Potassium: 5 mmol/L (ref 3.5–5.1)
Sodium: 138 mmol/L (ref 135–145)

## 2017-08-28 LAB — CBC
HCT: 47.8 % (ref 39.0–52.0)
Hemoglobin: 16.1 g/dL (ref 13.0–17.0)
MCH: 32.1 pg (ref 26.0–34.0)
MCHC: 33.7 g/dL (ref 30.0–36.0)
MCV: 95.2 fL (ref 78.0–100.0)
PLATELETS: 198 10*3/uL (ref 150–400)
RBC: 5.02 MIL/uL (ref 4.22–5.81)
RDW: 14.1 % (ref 11.5–15.5)
WBC: 4.6 10*3/uL (ref 4.0–10.5)

## 2017-08-31 ENCOUNTER — Ambulatory Visit: Payer: Medicare Other | Admitting: Podiatry

## 2017-08-31 DIAGNOSIS — B351 Tinea unguium: Secondary | ICD-10-CM | POA: Diagnosis not present

## 2017-08-31 DIAGNOSIS — Z79899 Other long term (current) drug therapy: Secondary | ICD-10-CM

## 2017-08-31 LAB — CBC WITH DIFFERENTIAL/PLATELET
BASOS ABS: 20 {cells}/uL (ref 0–200)
Basophils Relative: 0.4 %
EOS ABS: 123 {cells}/uL (ref 15–500)
EOS PCT: 2.5 %
HCT: 47.6 % (ref 38.5–50.0)
Hemoglobin: 16.4 g/dL (ref 13.2–17.1)
Lymphs Abs: 1597 cells/uL (ref 850–3900)
MCH: 31 pg (ref 27.0–33.0)
MCHC: 34.5 g/dL (ref 32.0–36.0)
MCV: 90 fL (ref 80.0–100.0)
MONOS PCT: 9.4 %
MPV: 10.6 fL (ref 7.5–12.5)
Neutro Abs: 2700 cells/uL (ref 1500–7800)
Neutrophils Relative %: 55.1 %
PLATELETS: 223 10*3/uL (ref 140–400)
RBC: 5.29 10*6/uL (ref 4.20–5.80)
RDW: 12.7 % (ref 11.0–15.0)
TOTAL LYMPHOCYTE: 32.6 %
WBC mixed population: 461 cells/uL (ref 200–950)
WBC: 4.9 10*3/uL (ref 3.8–10.8)

## 2017-08-31 LAB — HEPATIC FUNCTION PANEL
AG Ratio: 1.3 (calc) (ref 1.0–2.5)
ALT: 32 U/L (ref 9–46)
AST: 21 U/L (ref 10–35)
Albumin: 4 g/dL (ref 3.6–5.1)
Alkaline phosphatase (APISO): 61 U/L (ref 40–115)
BILIRUBIN INDIRECT: 0.4 mg/dL (ref 0.2–1.2)
Bilirubin, Direct: 0.1 mg/dL (ref 0.0–0.2)
Globulin: 3 g/dL (calc) (ref 1.9–3.7)
TOTAL PROTEIN: 7 g/dL (ref 6.1–8.1)
Total Bilirubin: 0.5 mg/dL (ref 0.2–1.2)

## 2017-08-31 MED ORDER — TERBINAFINE HCL 250 MG PO TABS: 250.0000 mg | ORAL_TABLET | Freq: Every day | ORAL | 0 refills | Status: DC

## 2017-08-31 NOTE — Patient Instructions (Signed)

## 2017-08-31 NOTE — Progress Notes (Signed)
Subjective: Ta[place] presents the office today for follow-up evaluation 6 weeks after starting Lamisil.  He states he is doing well he is having no side effects the medication and tolerating it well.  He denies any pain in the nails and denies any redness or drainage or any swelling.  Procedure site and a partial nail avulsions been doing very well and is having no issues. Denies any systemic complaints such as fevers, chills, nausea, vomiting. No acute changes since last appointment, and no other complaints at this time.   Objective: AAO x3, NAD DP/PT pulses palpable bilaterally, CRT less than 3 seconds It appears there is mild clearing on the proximal nail corners to the distal portion of the also remains hypertrophic, dystrophic with yellow-brown discoloration.  There is no pain in the nails there is no significant drainage.  There is minimal improvement. No open lesions or pre-ulcerative lesions.  No pain with calf compression, swelling, warmth, erythema  Assessment: Onychomycosis, currently on Lamisil  Plan: -All treatment options discussed with the patient including all alternatives, risks, complications.  -At this time he is tolerating medication well without any side effects.  We will continue Lamisil for a total of 3 months.  I will see him back at that time and if needed we will consider 1/66-month.  Continue to monitor for side effects.  We will recheck CBC and LFT today. -Patient encouraged to call the office with any questions, concerns, change in symptoms.    [name] DPM

## 2017-09-01 ENCOUNTER — Telehealth: Payer: Self-pay | Admitting: *Deleted

## 2017-09-01 MED ORDER — TERBINAFINE HCL 250 MG PO TABS: 250.0000 mg | ORAL_TABLET | Freq: Every day | ORAL | 0 refills | Status: AC

## 2017-09-01 NOTE — Telephone Encounter (Signed)
Left message informing pt of Dr. Wagoner's review of results and orders. 

## 2017-09-01 NOTE — Telephone Encounter (Signed)
Request for #[phone] Terbinafine. Dr. Jacqualyn Posey ordered 08/31/2017, [facility] confirmed receipt but sent request. I reordered on today's date.

## 2017-09-01 NOTE — Telephone Encounter (Signed)
-----   Message from [name], DPM sent at 09/01/2017  9:36 AM EST ----- Please let him know that his blood work is normal and can continue Lamisil. Thanks.

## 2017-09-04 NOTE — H&P (Signed)
CC: I have blood in my urine.  HPI: Derrick Donaldson is a 67 year-old male established patient who is here for blood in the urine.  He did see the blood in his urine. He first noticed the symptoms 07/18/2017. He has seen blood clots.   He does have a burning sensation when he urinates.   He is having pain.   Derrick Donaldson returns today for further evaluation of the hematuria. He had a CT today that showed a 3cm bladder stone with a large prostate. He has variable voiding symptoms. His PSA with Dr. Felipa Eth was up to 6.28 from 4.46 earlier last year as well.    GU Hx: Derrick Donaldson is a 67 yo WM former patient of Dr. Gaynelle Arabian with a history of stones. He was last seen in 2012. He had the onset last Tuesday of gross hematuria with some penile discomfort and dysuria but no flank pain. He has seen it once or twice before but it resolved. The urine has been clear since Friday. He has microhematuria today. His last stone was in 2012 and it was primarily calcium oxalate. He has a weak stream with moderate LUTS. He has some urgency and frequency q2hrs. IPSS is 17. He has had no GU surgery or UTI's. He had a PSA last week that was up but he doesn't recall the level. He had no evidence of UTI on a UA last week but his PSA was up.     ALLERGIES: No Allergies    MEDICATIONS: Gabapentin 300 mg capsule  Tamsulosin Hcl 0.4 mg capsule Oral     GU PSH: No GU PSH      PSH Notes: No Surgical Problems   NON-GU PSH: No Non-GU PSH    GU PMH: BPH w/LUTS, He has BPH with BOO with moderate LUTS and benefits from tamsulosin. I will get a flowrate and PVR with cystoscopy to complete his hematuria evaluation and evaluate the outlet. - 07/24/2017 ED due to arterial insufficiency, He has progressive ED which is likely vascular but could be related to the neuropathy. He had side effects and an incomplete response with sildenafil. I have give him [place] 200mg  samples to try and reviewed the side effects and instructions and  he will start with 1/2 tab. - 07/24/2017 Elevated PSA, His PSA is up to 6.28 with a benign exam. He will likely need a biopsy in the future once we get the hematuria managed. - 07/24/2017 Gross hematuria, He had gross hematuria with some irritative symptoms and now persistent microhematuria. He didn't have pain to suggest a stone so I will get a CT hematuria study. - 07/24/2017 History of urolithiasis - 07/24/2017 Weak Urinary Stream - 07/24/2017 Bladder Stone, Bladder calculus - 2014 Hydronephrosis Unspec, Hydronephrosis, left - 2014 Ureteral calculus, Calculus of left ureter - 2014      PMH Notes:  1898-07-11 00:00:00 - Note: Normal Routine History And Physical Adult   NON-GU PMH: Personal history of other endocrine, nutritional and metabolic disease, History of hypercholesterolemia - 2014 Hypercholesterolemia Idiopathic progressive neuropathy    FAMILY HISTORY: cardiac arrhythmia - Runs in Family nephrolithiasis - Mother neuropathy - Runs in Family   SOCIAL HISTORY: Marital Status: Married Preferred Language: English; Race: White Current Smoking Status: Patient has never smoked.   Tobacco Use Assessment Completed: Used Tobacco in last 30 days? Drinks 2 drinks per day.  Drinks 1 caffeinated drink per day. 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     Notes: 1 son, 1 daughter  REVIEW OF SYSTEMS:    GU Review Male:   Patient denies frequent urination, hard to postpone urination, burning/ pain with urination, get up at night to urinate, leakage of urine, stream starts and stops, trouble starting your stream, have to strain to urinate , erection problems, and penile pain.  Gastrointestinal (Upper):   Patient denies nausea, vomiting, and indigestion/ heartburn.  Gastrointestinal (Lower):   Patient denies diarrhea and constipation.  Constitutional:   Patient denies fever, night sweats, weight loss, and fatigue.  Skin:   Patient denies skin rash/ lesion and itching.  Eyes:   Patient denies  blurred vision and double vision.  Ears/ Nose/ Throat:   Patient denies sore throat and sinus problems.  Hematologic/Lymphatic:   Patient denies swollen glands and easy bruising.  Cardiovascular:   Patient denies leg swelling and chest pains.  Respiratory:   Patient denies cough and shortness of breath.  Endocrine:   Patient denies excessive thirst.  Musculoskeletal:   Patient denies back pain and joint pain.  Neurological:   Patient denies headaches and dizziness.  Psychologic:   Patient denies depression and anxiety.   VITAL SIGNS:      07/26/2017 11:36 AM  BP 136/91 mmHg  Heart Rate 65 /min  Temperature 97.1 F / 36.1 C   PAST DATA REVIEWED:  Source Of History:  Patient   PROCEDURES:         Flexible Cystoscopy - 52000  Risks, benefits, and some of the potential complications of the procedure were discussed. 22ml of 2% lidocaine jelly was instilled intraurethrally.     Meatus:  Normal size. Normal location. Normal condition.  Urethra:  No strictures.  External Sphincter:  Normal.  Verumontanum:  Normal.  Prostate:  Obstructing. Enlarged median lobe. Severe hyperplasia with 5-6cm prostatic urethra.  Bladder Neck:  Non-obstructing.  Ureteral Orifices:  Normal location. Normal size. Normal shape. Effluxed clear urine.  Bladder:  Moderate trabeculation. Erythematous mucosa with hemorrhagic changes from the stone which is about 3cm in greatest diameter. No tumors.      The procedure was well tolerated and there were no complications.         C.T. Hematuria - 74178, Z6629      ISOVUE 300 125CC            Flow Rate - 51741  Flow Time: 0:39 min:sec  Voided Volume: 356 cc  Peak Flow Rate: 13 cc/sec  Time of Peak Flow: 0:07 min:sec  Average Flow Rate: 9 cc/sec  Total Void Time: 0:39 min:sec           PVR Ultrasound - 47654  Scanned Volume: 7 cc         Urinalysis Dipstick Dipstick Cont'd  Color: Yellow Bilirubin: Neg  Appearance: Clear Ketones: Neg  Specific  Gravity: 1.015 Blood: Neg  pH: 6.5 Protein: Neg  Glucose: Neg Urobilinogen: 0.2    Nitrites: Neg    Leukocyte Esterase: Neg    ASSESSMENT:      ICD-10 Details  1 GU:   Bladder Stone - N21.0 Stable - He has a 3cm bladder stone which appears to be the cause of the hematuria.   2   BPH w/LUTS - N40.1 He has an adequate stream and is emptying well but has obstruction with intravesical protrusion that will make clearance of the stone difficult without prostate resection or removal either with TURP or simple prostatectomy with associated stone removal.   3   Weak Urinary Stream - R39.12   4  Elevated PSA - R97.20 He has an elevated PSA, so I am going to get him set up for a prostate [place] and biopsy. If he has cancer, that may alter our treatment decision and also the [place] will give me more complete assessment of the prostate volume to help direct subsequent therapy. He was given the instructions and levaquin.    PLAN:            Medications New Meds: Levaquin 750 mg tablet 1 tablet PO Daily Take 1 hour prior to procedure.  #1  0 Refill(s)            Schedule Return Visit/Planned Activity: ASAP - TRUSP          Document Letter(s):  Created for Patient: Clinical Summary

## 2017-09-05 ENCOUNTER — Encounter (HOSPITAL_COMMUNITY)
Admission: RE | Disposition: A | Discharge status: Home / Self Care | Payer: Self-pay | Source: Ambulatory Visit | Attending: Urology

## 2017-09-05 ENCOUNTER — Observation Stay (HOSPITAL_COMMUNITY)
Admission: RE | Admit: 2017-09-05 | Discharge: 2017-09-07 | Disposition: A | Discharge status: Home / Self Care | Payer: Medicare Other | Source: Ambulatory Visit | Attending: Urology | Admitting: Urology

## 2017-09-05 ENCOUNTER — Ambulatory Visit (HOSPITAL_COMMUNITY): Payer: Medicare Other | Admitting: Certified Registered Nurse Anesthetist

## 2017-09-05 ENCOUNTER — Other Ambulatory Visit: Payer: Self-pay

## 2017-09-05 ENCOUNTER — Encounter (HOSPITAL_COMMUNITY): Payer: Self-pay

## 2017-09-05 DIAGNOSIS — E7800 Pure hypercholesterolemia, unspecified: Secondary | ICD-10-CM | POA: Insufficient documentation

## 2017-09-05 DIAGNOSIS — Z79899 Other long term (current) drug therapy: Secondary | ICD-10-CM | POA: Insufficient documentation

## 2017-09-05 DIAGNOSIS — Z87442 Personal history of urinary calculi: Secondary | ICD-10-CM | POA: Diagnosis not present

## 2017-09-05 DIAGNOSIS — N401 Enlarged prostate with lower urinary tract symptoms: Secondary | ICD-10-CM | POA: Diagnosis not present

## 2017-09-05 DIAGNOSIS — N138 Other obstructive and reflux uropathy: Secondary | ICD-10-CM | POA: Diagnosis present

## 2017-09-05 DIAGNOSIS — R3129 Other microscopic hematuria: Secondary | ICD-10-CM | POA: Insufficient documentation

## 2017-09-05 DIAGNOSIS — N320 Bladder-neck obstruction: Secondary | ICD-10-CM | POA: Insufficient documentation

## 2017-09-05 DIAGNOSIS — N210 Calculus in bladder: Secondary | ICD-10-CM | POA: Diagnosis not present

## 2017-09-05 DIAGNOSIS — Z95810 Presence of automatic (implantable) cardiac defibrillator: Secondary | ICD-10-CM | POA: Insufficient documentation

## 2017-09-05 HISTORY — PX: CYSTOSCOPY WITH LITHOLAPAXY: SHX1425

## 2017-09-05 HISTORY — PX: TRANSURETHRAL RESECTION OF PROSTATE: SHX73

## 2017-09-05 SURGERY — TURP (TRANSURETHRAL RESECTION OF PROSTATE)
Anesthesia: General

## 2017-09-05 MED ORDER — 0.9 % SODIUM CHLORIDE (POUR BTL) OPTIME
TOPICAL | Status: DC | PRN
  Administered 2017-09-05: 1000 mL

## 2017-09-05 MED ORDER — CEPHALEXIN 500 MG PO CAPS
500.0000 mg | ORAL_CAPSULE | Freq: Three times a day (TID) | ORAL | Status: DC
  Administered 2017-09-05 – 2017-09-07 (×6): 500 mg via ORAL
  Filled 2017-09-05 (×6): qty 1

## 2017-09-05 MED ORDER — ZOLPIDEM TARTRATE 5 MG PO TABS: 5.0000 mg | ORAL_TABLET | Freq: Every evening | ORAL | Status: DC | PRN

## 2017-09-05 MED ORDER — CEFAZOLIN SODIUM-DEXTROSE 2-4 GM/100ML-% IV SOLN
INTRAVENOUS | Status: AC
  Filled 2017-09-05: qty 100

## 2017-09-05 MED ORDER — FLUTICASONE PROPIONATE 50 MCG/ACT NA SUSP
1.0000 | Freq: Every day | NASAL | Status: DC
  Administered 2017-09-06 – 2017-09-07 (×2): 1 via NASAL
  Filled 2017-09-05: qty 16

## 2017-09-05 MED ORDER — OXYCODONE HCL 5 MG PO TABS: 5.0000 mg | ORAL_TABLET | Freq: Once | ORAL | Status: DC | PRN

## 2017-09-05 MED ORDER — PROPOFOL 10 MG/ML IV BOLUS
INTRAVENOUS | Status: DC | PRN
  Administered 2017-09-05: 180 mg via INTRAVENOUS

## 2017-09-05 MED ORDER — OXYCODONE HCL 5 MG/5ML PO SOLN
5.0000 mg | Freq: Once | ORAL | Status: DC | PRN
  Filled 2017-09-05: qty 5

## 2017-09-05 MED ORDER — MIDAZOLAM HCL 2 MG/2ML IJ SOLN
INTRAMUSCULAR | Status: AC
  Filled 2017-09-05: qty 2

## 2017-09-05 MED ORDER — MORPHINE SULFATE (PF) 4 MG/ML IV SOLN: 2.0000 mg | INTRAVENOUS | Status: DC | PRN

## 2017-09-05 MED ORDER — DEXAMETHASONE SODIUM PHOSPHATE 10 MG/ML IJ SOLN
INTRAMUSCULAR | Status: DC | PRN
  Administered 2017-09-05: 5 mg via INTRAVENOUS

## 2017-09-05 MED ORDER — CEFAZOLIN SODIUM-DEXTROSE 2-4 GM/100ML-% IV SOLN
2.0000 g | INTRAVENOUS | Status: AC
  Administered 2017-09-05: 2 g via INTRAVENOUS

## 2017-09-05 MED ORDER — GABAPENTIN 300 MG PO CAPS: 300.0000 mg | ORAL_CAPSULE | Freq: Every day | ORAL | Status: DC | PRN

## 2017-09-05 MED ORDER — ONDANSETRON HCL 4 MG/2ML IJ SOLN: 4.0000 mg | INTRAMUSCULAR | Status: DC | PRN

## 2017-09-05 MED ORDER — LORATADINE 10 MG PO TABS
10.0000 mg | ORAL_TABLET | Freq: Every day | ORAL | Status: DC
  Administered 2017-09-06 – 2017-09-07 (×2): 10 mg via ORAL
  Filled 2017-09-05 (×2): qty 1

## 2017-09-05 MED ORDER — PHENYLEPHRINE 40 MCG/ML (10ML) SYRINGE FOR IV PUSH (FOR BLOOD PRESSURE SUPPORT)
PREFILLED_SYRINGE | INTRAVENOUS | Status: DC | PRN
  Administered 2017-09-05 (×2): 80 ug via INTRAVENOUS

## 2017-09-05 MED ORDER — SENNOSIDES-DOCUSATE SODIUM 8.6-50 MG PO TABS
1.0000 | ORAL_TABLET | Freq: Every evening | ORAL | Status: DC | PRN
Start: 2017-09-05 — End: 2017-09-07

## 2017-09-05 MED ORDER — EPHEDRINE 5 MG/ML INJ
INTRAVENOUS | Status: AC
  Filled 2017-09-05: qty 10

## 2017-09-05 MED ORDER — ONDANSETRON HCL 4 MG/2ML IJ SOLN: 4.0000 mg | Freq: Four times a day (QID) | INTRAMUSCULAR | Status: DC | PRN

## 2017-09-05 MED ORDER — ONDANSETRON HCL 4 MG/2ML IJ SOLN
INTRAMUSCULAR | Status: DC | PRN
  Administered 2017-09-05: 4 mg via INTRAVENOUS

## 2017-09-05 MED ORDER — POTASSIUM CHLORIDE IN NACL 20-0.45 MEQ/L-% IV SOLN
INTRAVENOUS | Status: DC
  Administered 2017-09-05 – 2017-09-06 (×2): via INTRAVENOUS
  Filled 2017-09-05 (×3): qty 1000

## 2017-09-05 MED ORDER — TERBINAFINE HCL 250 MG PO TABS
250.0000 mg | ORAL_TABLET | Freq: Every day | ORAL | Status: DC
  Administered 2017-09-06: 250 mg via ORAL
  Filled 2017-09-05 (×3): qty 1

## 2017-09-05 MED ORDER — BISACODYL 10 MG RE SUPP: 10.0000 mg | Freq: Every day | RECTAL | Status: DC | PRN

## 2017-09-05 MED ORDER — HYOSCYAMINE SULFATE 0.125 MG SL SUBL
0.1250 mg | SUBLINGUAL_TABLET | SUBLINGUAL | Status: DC | PRN
  Administered 2017-09-05: 0.125 mg via SUBLINGUAL
  Filled 2017-09-05: qty 1

## 2017-09-05 MED ORDER — FLEET ENEMA 7-19 GM/118ML RE ENEM: 1.0000 | ENEMA | Freq: Once | RECTAL | Status: DC | PRN

## 2017-09-05 MED ORDER — LACTATED RINGERS IV SOLN
INTRAVENOUS | Status: DC
  Administered 2017-09-05 (×2): via INTRAVENOUS

## 2017-09-05 MED ORDER — MIDAZOLAM HCL 5 MG/5ML IJ SOLN
INTRAMUSCULAR | Status: DC | PRN
  Administered 2017-09-05: 2 mg via INTRAVENOUS

## 2017-09-05 MED ORDER — HYOSCYAMINE SULFATE 0.125 MG SL SUBL
SUBLINGUAL_TABLET | SUBLINGUAL | Status: AC
  Administered 2017-09-05: 0.125 mg via SUBLINGUAL
  Filled 2017-09-05: qty 1

## 2017-09-05 MED ORDER — EPHEDRINE SULFATE-NACL 50-0.9 MG/10ML-% IV SOSY
PREFILLED_SYRINGE | INTRAVENOUS | Status: DC | PRN
  Administered 2017-09-05: 15 mg via INTRAVENOUS
  Administered 2017-09-05: 10 mg via INTRAVENOUS

## 2017-09-05 MED ORDER — FENTANYL CITRATE (PF) 100 MCG/2ML IJ SOLN
INTRAMUSCULAR | Status: DC | PRN
  Administered 2017-09-05 (×2): 50 ug via INTRAVENOUS

## 2017-09-05 MED ORDER — FENTANYL CITRATE (PF) 100 MCG/2ML IJ SOLN: 25.0000 ug | INTRAMUSCULAR | Status: DC | PRN

## 2017-09-05 MED ORDER — ACETAMINOPHEN 325 MG PO TABS: 650.0000 mg | ORAL_TABLET | ORAL | Status: DC | PRN

## 2017-09-05 MED ORDER — HYDROCODONE-ACETAMINOPHEN 5-325 MG PO TABS: 1.0000 | ORAL_TABLET | ORAL | Status: DC | PRN

## 2017-09-05 MED ORDER — PROPOFOL 10 MG/ML IV BOLUS
INTRAVENOUS | Status: AC
  Filled 2017-09-05: qty 20

## 2017-09-05 MED ORDER — FENTANYL CITRATE (PF) 100 MCG/2ML IJ SOLN
INTRAMUSCULAR | Status: AC
  Filled 2017-09-05: qty 2

## 2017-09-05 MED ORDER — DOCUSATE SODIUM 100 MG PO CAPS
100.0000 mg | ORAL_CAPSULE | Freq: Two times a day (BID) | ORAL | Status: DC
  Administered 2017-09-05 – 2017-09-07 (×4): 100 mg via ORAL
  Filled 2017-09-05 (×4): qty 1

## 2017-09-05 MED ORDER — LIDOCAINE 2% (20 MG/ML) 5 ML SYRINGE
INTRAMUSCULAR | Status: DC | PRN
  Administered 2017-09-05: 60 mg via INTRAVENOUS

## 2017-09-05 MED ORDER — SODIUM CHLORIDE 0.9 % IR SOLN
Status: DC | PRN
  Administered 2017-09-05: 30000 mL

## 2017-09-05 SURGICAL SUPPLY — 21 items
BAG URINE DRAINAGE (UROLOGICAL SUPPLIES) ×2 IMPLANT
BAG URO CATCHER STRL LF (MISCELLANEOUS) ×2 IMPLANT
CATH FOLEY 3WAY 30CC 22FR (CATHETERS) ×2 IMPLANT
CATH URET 5FR 28IN OPEN ENDED (CATHETERS) IMPLANT
CLOTH BEACON ORANGE TIMEOUT ST (SAFETY) IMPLANT
COVER FOOTSWITCH UNIV (MISCELLANEOUS) IMPLANT
COVER SURGICAL LIGHT HANDLE (MISCELLANEOUS) ×2 IMPLANT
ELECT REM PT RETURN 15FT ADLT (MISCELLANEOUS) IMPLANT
FIBER LASER FLEXIVA 1000 (UROLOGICAL SUPPLIES) ×2 IMPLANT
FIBER LASER FLEXIVA 550 (UROLOGICAL SUPPLIES) IMPLANT
GLOVE SURG SS PI 8.0 STRL IVOR (GLOVE) IMPLANT
GOWN STRL REUS W/TWL XL LVL3 (GOWN DISPOSABLE) ×2 IMPLANT
HOLDER FOLEY CATH W/STRAP (MISCELLANEOUS) ×2 IMPLANT
LOOP CUT BIPOLAR 24F LRG (ELECTROSURGICAL) ×2 IMPLANT
MANIFOLD NEPTUNE II (INSTRUMENTS) ×2 IMPLANT
PACK CYSTO (CUSTOM PROCEDURE TRAY) ×2 IMPLANT
SET ASPIRATION TUBING (TUBING) IMPLANT
SYR 30ML LL (SYRINGE) ×2 IMPLANT
SYRINGE IRR TOOMEY STRL 70CC (SYRINGE) ×2 IMPLANT
TUBING CONNECTING 10 (TUBING) ×2 IMPLANT
TUBING UROLOGY SET (TUBING) ×2 IMPLANT

## 2017-09-05 NOTE — Anesthesia Preprocedure Evaluation (Signed)
Anesthesia Evaluation  Patient identified by MRN, date of birth, ID band Patient awake    Reviewed: Allergy & Precautions, H&P , NPO status , Patient's Chart, lab work & pertinent test results  Airway Mallampati: II   Neck ROM: full    Dental   Pulmonary neg pulmonary ROS,    breath sounds clear to auscultation       Cardiovascular negative cardio ROS   Rhythm:regular Rate:Normal     Neuro/Psych    GI/Hepatic   Endo/Other    Renal/GU      Musculoskeletal   Abdominal   Peds  Hematology   Anesthesia Other Findings   Reproductive/Obstetrics                             Anesthesia Physical Anesthesia Plan  ASA: II  Anesthesia Plan: General   Post-op Pain Management:    Induction: Intravenous  PONV Risk Score and Plan: 2 and Ondansetron, Dexamethasone, Midazolam and Treatment may vary due to age or medical condition  Airway Management Planned: LMA  Additional Equipment:   Intra-op Plan:   Post-operative Plan:   Informed Consent: I have reviewed the patients History and Physical, chart, labs and discussed the procedure including the risks, benefits and alternatives for the proposed anesthesia with the patient or authorized representative who has indicated his/her understanding and acceptance.     Plan Discussed with: CRNA, Anesthesiologist and Surgeon  Anesthesia Plan Comments:         Anesthesia Quick Evaluation

## 2017-09-05 NOTE — Interval H&P Note (Signed)
History and Physical Interval Note:  09/05/2017 9:57 AM  Derrick Donaldson  has presented today for surgery, with the diagnosis of [place]  The various methods of treatment have been discussed with the patient and family. After consideration of risks, benefits and other options for treatment, the patient has consented to  Procedure(s): TRANSURETHRAL RESECTION OF THE PROSTATE (TURP) (N/A) CYSTOSCOPY WITH LITHOLAPAXY (N/A) as a surgical intervention .  The patient's history has been reviewed, patient examined, no change in status, stable for surgery.  I have reviewed the patient's chart and labs.  Questions were answered to the patient's satisfaction.     [name]

## 2017-09-05 NOTE — Anesthesia Postprocedure Evaluation (Signed)
Anesthesia Post Note  Patient: EERO DINI  Procedure(s) Performed: TRANSURETHRAL RESECTION OF THE PROSTATE (TURP) (N/A ) CYSTOSCOPY WITH LITHOLAPAXY (N/A )     Patient location during evaluation: PACU Anesthesia Type: General Level of consciousness: awake and alert Pain management: pain level controlled Vital Signs Assessment: post-procedure vital signs reviewed and stable Respiratory status: spontaneous breathing, nonlabored ventilation, respiratory function stable and patient connected to nasal cannula oxygen Cardiovascular status: blood pressure returned to baseline and stable Postop Assessment: no apparent nausea or vomiting Anesthetic complications: no    Last Vitals:  Vitals:   09/05/17 1245 09/05/17 1300  BP: 121/81 123/82  Pulse: 68 66  Resp: 12 (!) 21  Temp:  (!) 36.1 C  SpO2: 95% 96%    Last Pain:  Vitals:   09/05/17 1300  TempSrc:   PainSc: 0-No pain                 Camala Talwar S

## 2017-09-05 NOTE — Op Note (Signed)
Procedure: 1.  Cystolitholapaxy of 3 cm stone. 2.  Transurethral resection of the prostate.  Preop diagnosis: BPH with bladder outlet obstruction and 3 cm bladder stone.  Postop diagnosis: Same.  Surgeon: [name] MD.  Anesthesia: General.  Drain: 74 French three-way Foley catheter.  Specimen: 1.  Bladder stone fragments. 2.  Prostate chips.  EBL: Approximately 200 mL.  Complications: None.  Indication: Derrick Donaldson is a 67 year old male who has a 3 cm bladder stone and a 95 mL prostate with obstruction.  He is to undergo cystolitholapaxy and TURP.  Procedure: He was given antibiotic and taken to the operating room.  A general anesthetic was induced.  He was placed in the lithotomy position and fitted with PAS hose.  His perineum and genitalia were prepped with Betadine solution and he was draped in the usual sterile fashion.  Cystoscopy was performed with a 23 [place] scope and 30 degree lens.  Examination revealed a normal urethra.  The external sphincter was intact.  The prostatic urethra was 4-5 cm in length with trilobar hyperplasia with a small middle lobe.  The ureteral orifice ease were unremarkable.  The bladder wall had mild trabeculation with no mucosal lesions.  There was an irregular 3 cm light brown stone in the left bladder.  Urethra was calibrated to 30 [place] with [employer] sounds.  A 26 French continuous-flow resectoscope sheath was placed with the visual obturator.  The visual obturator was replaced with the laser bridge and the 30 degree lens.  A 1000 m holmium laser fiber was inserted and the stone was engaged at a power of 2 W and a frequency of 20 Hz.  The stone was gradually reduced small fragments with the laser.  The fragments were then evacuated from the bladder.  The laser bridge was then switched for an Iglesias resectoscope with the bipolar loop.  The bladder neck was then resected along with the middle lobe from 5 to 7:00.  The floor of the prostate was then  resected out to alongside the verumontanum.  The right lateral lobe was resected from bladder neck to apex out to the capsular fibers.  The left lobe was then resected from bladder neck to apex and out to the capsular fibers.  Initial chip evacuation was performed and I then resected residual apical and anterior tissue.  Final chip removal was performed and hemostasis was achieved.  Final inspection revealed no retained chips, only minimal stone dust, intact ureteral orifice ease and external sphincter and no active bleeding.  The scope was removed and pressure on the bladder produced an excellent stream.  A 22 French three-way Foley catheter was inserted with the aid of a catheter guide and the balloon was inflated with 30 mls of sterile water.  The catheter was irrigated with clear return and was placed to continuous irrigation with normal saline and a drainage bag.  He was taken down from lithotomy position, his anesthetic was reversed and he was moved to recovery room in stable condition.  There were no complications.

## 2017-09-05 NOTE — Anesthesia Procedure Notes (Signed)
Procedure Name: LMA Insertion Performed by: Gean Maidens, CRNA Pre-anesthesia Checklist: Patient identified, Emergency Drugs available, Suction available, Patient being monitored and Timeout performed Patient Re-evaluated:Patient Re-evaluated prior to induction Oxygen Delivery Method: Circle system utilized Preoxygenation: Pre-oxygenation with 100% oxygen Induction Type: IV induction Ventilation: Mask ventilation without difficulty LMA: LMA inserted LMA Size: 4.0 Number of attempts: 1 Placement Confirmation: positive ETCO2,  CO2 detector and breath sounds checked- equal and bilateral Tube secured with: Tape

## 2017-09-05 NOTE — Discharge Instructions (Signed)
Transurethral Resection of the Prostate, Care After °Refer to this sheet in the next few weeks. These instructions provide you with information about caring for yourself after your procedure. Your health care provider may also give you more specific instructions. Your treatment has been planned according to current medical practices, but problems sometimes occur. Call your health care provider if you have any problems or questions after your procedure. °What can I expect after the procedure? °After the procedure, it is common to have: °· Mild pain in your lower abdomen. °· Soreness or mild discomfort in your penis from having the catheter inserted during the procedure. °· A feeling of urgency when you need to urinate. °· A small amount of blood in your urine. You may notice some small blood clots in your urine. These are normal. ° °Follow these instructions at home: °Medicines ° °· Take over-the-counter and prescription medicines only as told by your health care provider. °· Do not drive or operate heavy machinery while taking prescription pain medicine. °· Do not drive for 24 hours if you received a sedative. °· If you were prescribed antibiotic medicine, take it as told by your health care provider. Do not stop taking the antibiotic even if you start to feel better. °Activity °· Return to your normal activities as told by your health care provider. Ask your health care provider what activities are safe for you. °· Do not lift anything that is heavier than 10 lb (4.5 kg) for 3 weeks after your procedure, or as long as told by your health care provider. °· Avoid intense physical activity for as long as told by your health care provider. °· Walk at least one time every day. This helps to prevent blood clots. You may increase your physical activity gradually as you start to feel better. °Lifestyle °· Do not drink alcohol for as long as told by your health care provider. This is especially important if you are taking  prescription pain medicines. °· Do not engage in sexual activity until your health care provider says that you can do this. °General instructions °· Do not take baths, swim, or use a hot tub until your health care provider approves. °· Drink enough fluid to keep your urine clear or pale yellow. °· Urinate as soon as you feel the need to. Do not try to hold your urine for long periods of time. °· If your health care provider approves, you may take a stool softener for 2-3 weeks to prevent you from straining to have a bowel movement. °· Wear compression stockings as told by your health care provider. These stockings help to prevent blood clots and reduce swelling in your legs. °· Keep all follow-up visits as told by your health care provider. This is important. °Contact a health care provider if: °· You have difficulty urinating. °· You have a fever. °· You have pain that gets worse or does not improve with medicine. °· You have blood in your urine that does not go away after 1 week of resting and drinking more fluids. °· You have swelling in your penis or testicles. °Get help right away if: °· You are unable to urinate. °· You are having more blood clots in your urine instead of fewer. °· You have: °? Large blood clots. °? A lot of blood in your urine. °? Pain in your back or lower abdomen. °? Pain or swelling in your legs. °? Chills and you are shaking. °This information is not intended to   replace advice given to you by your health care provider. Make sure you discuss any questions you have with your health care provider. °Document Released: 06/27/2005 Document Revised: 02/28/2016 Document Reviewed: 03/19/2015 °Elsevier Interactive Patient Education © 2017 [employer] ° °

## 2017-09-05 NOTE — Transfer of Care (Signed)
Immediate Anesthesia Transfer of Care Note  Patient: Derrick Donaldson  Procedure(s) Performed: TRANSURETHRAL RESECTION OF THE PROSTATE (TURP) (N/A ) CYSTOSCOPY WITH LITHOLAPAXY (N/A )  Patient Location: PACU  Anesthesia Type:General  Level of Consciousness: sedated, patient cooperative and responds to stimulation  Airway & Oxygen Therapy: Patient Spontanous Breathing and Patient connected to face mask oxygen  Post-op Assessment: Report given to RN and Post -op Vital signs reviewed and stable  Post vital signs: Reviewed and stable  Last Vitals:  Vitals:   09/05/17 0813  BP: (!) 133/96  Pulse: 73  Resp: (!) 75  Temp: 36.8 C  SpO2: 98%    Last Pain:  Vitals:   09/05/17 0813  TempSrc: Oral         Complications: No apparent anesthesia complications

## 2017-09-06 DIAGNOSIS — N401 Enlarged prostate with lower urinary tract symptoms: Secondary | ICD-10-CM | POA: Diagnosis not present

## 2017-09-06 NOTE — Progress Notes (Signed)
1 Day Post-Op  Subjective: Doing well post op without complaint.  Urine is clear.  ROS:  Review of Systems  All other systems reviewed and are negative.   Anti-infectives: Anti-infectives (From admission, onward)   Start     Dose/Rate Route Frequency Ordered Stop   09/05/17 1600  cephALEXin (KEFLEX) capsule 500 mg     500 mg Oral 3 times daily 09/05/17 1353     09/05/17 1500  terbinafine (LAMISIL) tablet 250 mg     250 mg Oral Daily 09/05/17 1353     09/05/17 0830  ceFAZolin (ANCEF) 2-4 GM/100ML-% IVPB    Comments:  Kyra Leyland   : cabinet override      09/05/17 0830 09/05/17 1023   09/05/17 0824  ceFAZolin (ANCEF) IVPB 2g/100 mL premix     2 g 200 mL/hr over 30 Minutes Intravenous 30 min pre-op 09/05/17 0824 09/05/17 1053      Current Facility-Administered Medications  Medication Dose Route Frequency Provider Last Rate Last Dose  . 0.45 % NaCl with KCl 20 mEq / L infusion   Intravenous Continuous [name], MD 100 mL/hr at 09/06/17 0041    . acetaminophen (TYLENOL) tablet 650 mg  650 mg Oral Q4H PRN [name], MD      . bisacodyl (DULCOLAX) suppository 10 mg  10 mg Rectal Daily PRN [name], MD      . cephALEXin [facility] capsule 500 mg  500 mg Oral TID [name], MD   500 mg at 09/05/17 2110  . docusate sodium (COLACE) capsule 100 mg  100 mg Oral BID [name], MD   100 mg at 09/05/17 2110  . fluticasone (FLONASE) 50 MCG/ACT nasal spray 1 spray  1 spray Each Nare Daily [name], MD      . gabapentin (NEURONTIN) capsule 300 mg  300 mg Oral Daily PRN [name], MD      . HYDROcodone-acetaminophen (NORCO/VICODIN) 5-325 MG per tablet 1-2 tablet  1-2 tablet Oral Q4H PRN [name], MD      . hyoscyamine (LEVSIN SL) SL tablet 0.125 mg  0.125 mg Sublingual Q4H PRN [name], MD   0.125 mg at 09/05/17 1300  . loratadine (CLARITIN) tablet 10 mg  10 mg Oral Daily [name], MD      . morphine 4 MG/ML injection 2 mg  2 mg Intravenous Q2H PRN [name], MD       . ondansetron [facility]) injection 4 mg  4 mg Intravenous Q4H PRN [name], MD      . senna-docusate (Senokot-S) tablet 1 tablet  1 tablet Oral QHS PRN [name], MD      . sodium phosphate (FLEET) 7-19 GM/118ML enema 1 enema  1 enema Rectal Once PRN [name], MD      . terbinafine (LAMISIL) tablet 250 mg  250 mg Oral Daily [name], MD      . zolpidem (AMBIEN) tablet 5 mg  5 mg Oral QHS PRN [name], MD         Objective: Vital signs in last 24 hours: Temp:  [97 F (36.1 C)-97.8 F (36.6 C)] 97.8 F (36.6 C) (02/27 0414) Pulse Rate:  [61-73] 61 (02/27 0414) Resp:  [12-21] 18 (02/27 0414) BP: (113-130)/(73-82) 130/80 (02/27 0414) SpO2:  [93 %-99 %] 93 % (02/27 0414)  Intake/Output from previous day: 02/26 0701 - 02/27 0700 In: 7215.5 [P.O.:840; I.V.:2525.5] Out: 4696 [Urine:7425] Intake/Output this shift: No intake/output data recorded.   Physical Exam  Constitutional: He is well-developed, well-nourished, and in no distress.  Vitals reviewed.   Lab Results:  No results for input(s): WBC, HGB, HCT, PLT in the last 72 hours. BMET No results for input(s): NA, K, CL, CO2, GLUCOSE, BUN, CREATININE, CALCIUM in the last 72 hours. PT/INR No results for input(s): LABPROT, INR in the last 72 hours. ABG No results for input(s): PHART, HCO3 in the last 72 hours.  Invalid input(s): PCO2, PO2  Studies/Results: No results found.   Assessment and Plan: Doing well post TURP and cystolithalopaxy.   Will keep foley until 2/28 because of patient's large prostate.   Plan for D/C tomorrow.       LOS: 0 days    [name] 09/06/2017 [phone] ID: Derrick Donaldson, male   DOB: 1951-02-07, 67 y.o.   MRN: [id number]

## 2017-09-07 DIAGNOSIS — N210 Calculus in bladder: Secondary | ICD-10-CM | POA: Diagnosis present

## 2017-09-07 DIAGNOSIS — N401 Enlarged prostate with lower urinary tract symptoms: Secondary | ICD-10-CM | POA: Diagnosis not present

## 2017-09-07 NOTE — Progress Notes (Signed)
Discharge to home, d/c instructions and follow up appointments discussed with patient, verbalized understanding.  PIV removed no s/s of swelling and infiltrations noted.

## 2017-09-07 NOTE — Discharge Summary (Signed)
Physician Discharge Summary  Patient ID: Derrick Donaldson MRN: [id number] DOB/AGE: 02-21-1951 67 y.o.  Admit date: 09/05/2017 Discharge date: 09/07/2017  Admission Diagnoses:  BPH with obstruction/lower urinary tract symptoms  Discharge Diagnoses:  Principal Problem:   BPH with obstruction/lower urinary tract symptoms Active Problems:   Bladder calculus   Past Medical History:  Diagnosis Date  . History of kidney stones     Surgeries: Procedure(s): TRANSURETHRAL RESECTION OF THE PROSTATE (TURP) CYSTOSCOPY WITH LITHOLAPAXY on 09/05/2017   Consultants (if any):   Discharged Condition: Improved  Hospital Course: Derrick Donaldson is an 68 y.o. male who was admitted 09/05/2017 with a diagnosis of BPH with obstruction/lower urinary tract symptoms and went to the operating room on 09/05/2017 and underwent the above named procedures.  He did well postop.  His path was benign.  The foley was removed this morning and he has been able to void.   He was discharged.   He was given perioperative antibiotics:  Anti-infectives (From admission, onward)   Start     Dose/Rate Route Frequency Ordered Stop   09/05/17 1600  cephALEXin (KEFLEX) capsule 500 mg     500 mg Oral 3 times daily 09/05/17 1353     09/05/17 1500  terbinafine (LAMISIL) tablet 250 mg     250 mg Oral Daily 09/05/17 1353     09/05/17 0830  ceFAZolin (ANCEF) 2-4 GM/100ML-% IVPB    Comments:  Kyra Leyland   : cabinet override      09/05/17 0830 09/05/17 1023   09/05/17 0824  ceFAZolin (ANCEF) IVPB 2g/100 mL premix     2 g 200 mL/hr over 30 Minutes Intravenous 30 min pre-op 09/05/17 0824 09/05/17 1053    .  He was given sequential compression devices and early ambulation for DVT prophylaxis.  He benefited maximally from the hospital stay and there were no complications.    Recent vital signs:  Vitals:   09/06/17 2021 09/07/17 0431  BP: 117/79 109/62  Pulse: 69 65  Resp: 18 18  Temp: 98.6 F (37 C) 98.6 F (37  C)  SpO2: 92% 95%    Recent laboratory studies:  Lab Results  Component Value Date   HGB 16.4 08/31/2017   HGB 16.1 08/28/2017   HGB 16.1 07/20/2017   Lab Results  Component Value Date   WBC 4.9 08/31/2017   PLT 223 08/31/2017   No results found for: INR Lab Results  Component Value Date   NA 138 08/28/2017   K 5.0 08/28/2017   CL 104 08/28/2017   CO2 25 08/28/2017   BUN 15 08/28/2017   CREATININE 0.93 08/28/2017   GLUCOSE 107 (H) 08/28/2017    Discharge Medications:   Allergies as of 09/07/2017   No Known Allergies     Medication List    TAKE these medications   acetaminophen 325 MG tablet Commonly known as:  TYLENOL Take 325 mg by mouth every 6 (six) hours as needed (for pain.).   fluticasone 50 MCG/ACT nasal spray Commonly known as:  FLONASE Place 1 spray into the nose daily as needed for allergies.   gabapentin 300 MG capsule Commonly known as:  NEURONTIN Take 300 mg by mouth daily as needed (for pain).   loratadine 10 MG tablet Commonly known as:  CLARITIN Take 10 mg by mouth daily.   pseudoephedrine 30 MG tablet Commonly known as:  SUDAFED Take 30 mg by mouth every 4 (four) hours as needed for congestion.   terbinafine 250 MG  tablet Commonly known as:  LAMISIL Take 1 tablet (250 mg total) by mouth daily.       Diagnostic Studies: No results found.  Disposition: 01-Home or Self Care  Discharge Instructions    Discontinue IV   Complete by:  As directed       Follow-up Information    Karen Kays, NP Follow up on 09/19/2017.   Specialty:  Nurse Practitioner Why:  2 Contact information: [address] [place] [address] [phone]            Signed: [name] 09/07/2017, 8:21 AM

## 2017-10-31 ENCOUNTER — Ambulatory Visit: Payer: Medicare Other | Admitting: Podiatry

## 2017-10-31 DIAGNOSIS — B351 Tinea unguium: Secondary | ICD-10-CM

## 2017-10-31 DIAGNOSIS — Z79899 Other long term (current) drug therapy: Secondary | ICD-10-CM

## 2017-10-31 LAB — HEPATIC FUNCTION PANEL
AG RATIO: 1.4 (calc) (ref 1.0–2.5)
ALKALINE PHOSPHATASE (APISO): 69 U/L (ref 40–115)
ALT: 25 U/L (ref 9–46)
AST: 20 U/L (ref 10–35)
Albumin: 4.1 g/dL (ref 3.6–5.1)
BILIRUBIN DIRECT: 0.2 mg/dL (ref 0.0–0.2)
BILIRUBIN TOTAL: 0.7 mg/dL (ref 0.2–1.2)
Globulin: 2.9 g/dL (calc) (ref 1.9–3.7)
Indirect Bilirubin: 0.5 mg/dL (calc) (ref 0.2–1.2)
TOTAL PROTEIN: 7 g/dL (ref 6.1–8.1)

## 2017-10-31 LAB — CBC WITH DIFFERENTIAL/PLATELET
Basophils Absolute: 29 cells/uL (ref 0–200)
Basophils Relative: 0.6 %
EOS ABS: 139 {cells}/uL (ref 15–500)
Eosinophils Relative: 2.9 %
HEMATOCRIT: 46.5 % (ref 38.5–50.0)
HEMOGLOBIN: 16.1 g/dL (ref 13.2–17.1)
Lymphs Abs: 1747 cells/uL (ref 850–3900)
MCH: 31 pg (ref 27.0–33.0)
MCHC: 34.6 g/dL (ref 32.0–36.0)
MCV: 89.6 fL (ref 80.0–100.0)
MONOS PCT: 10.2 %
MPV: 10.3 fL (ref 7.5–12.5)
NEUTROS ABS: 2395 {cells}/uL (ref 1500–7800)
Neutrophils Relative %: 49.9 %
Platelets: 227 10*3/uL (ref 140–400)
RBC: 5.19 10*6/uL (ref 4.20–5.80)
RDW: 12.4 % (ref 11.0–15.0)
Total Lymphocyte: 36.4 %
WBC mixed population: 490 cells/uL (ref 200–950)
WBC: 4.8 10*3/uL (ref 3.8–10.8)

## 2017-10-31 NOTE — Patient Instructions (Signed)

## 2017-11-01 ENCOUNTER — Telehealth: Payer: Self-pay | Admitting: *Deleted

## 2017-11-01 DIAGNOSIS — B351 Tinea unguium: Secondary | ICD-10-CM | POA: Insufficient documentation

## 2017-11-01 NOTE — Telephone Encounter (Signed)
I informed pt of Dr. Wagoner's review of results and orders. 

## 2017-11-01 NOTE — Telephone Encounter (Signed)
-----   Message from [name], DPM sent at 11/01/2017 11:28 AM EDT ----- Tivis Ringer- can you please let him know that his blood work is normal and can continue Lamisil? Thanks.

## 2017-11-01 NOTE — Progress Notes (Signed)
Subjective: 67 year old male presents the office today for follow-up evaluation of Lamisil.  Discussed with his 61-month course he states he is doing better and the nails are growing out.  Denies any pain in the nails and denies any redness or drainage or any swelling. Denies any systemic complaints such as fevers, chills, nausea, vomiting. No acute changes since last appointment, and no other complaints at this time.   Objective: AAO x3, NAD DP/PT pulses palpable bilaterally, CRT less than 3 seconds Nails appear to be clearing on the proximal aspect about one third and the distal portion continues to be hypertrophic, dystrophic, discolored with ill-defined discoloration.  There is no surrounding redness or drainage or any clinical signs of infection. No open lesions or pre-ulcerative lesions.  No pain with calf compression, swelling, warmth, erythema  Assessment: Resolving onychomycosis, currently on Lamisil  Plan: -All treatment options discussed with the patient including all alternatives, risks, complications.  -At this point is most with his 15-month course however it is growing out but still remains.  I am hesitant to stop medication at this point.  Because this will continued on the fourth month we will recheck a CBC and LFT prior to starting this.  He is tolerating medication well any side effects we will continue to monitor closely. -Patient encouraged to call the office with any questions, concerns, change in symptoms.   [name] DPM

## 2019-08-06 ENCOUNTER — Ambulatory Visit: Payer: Medicare Other

## 2019-08-07 ENCOUNTER — Ambulatory Visit: Payer: Medicare Other

## 2019-08-15 ENCOUNTER — Ambulatory Visit: Payer: Medicare PPO | Attending: Internal Medicine

## 2019-08-15 DIAGNOSIS — Z23 Encounter for immunization: Secondary | ICD-10-CM | POA: Insufficient documentation

## 2019-08-15 NOTE — Progress Notes (Signed)
   Covid-19 Vaccination Clinic  Name:  Derrick Donaldson    MRN: [id number] DOB: 11-09-1950  08/15/2019  Derrick Donaldson was observed post Covid-19 immunization for 15 minutes without incidence. He was provided with Vaccine Information Sheet and instruction to access the V-Safe system.   Derrick Donaldson was instructed to call 911 with any severe reactions post vaccine: [name] Difficulty breathing  . Swelling of your face and throat  . A fast heartbeat  . A bad rash all over your body  . Dizziness and weakness    Immunizations Administered    Name Date Dose VIS Date Route   Pfizer COVID-19 Vaccine 08/15/2019  6:37 PM 0.3 mL 06/21/2019 Intramuscular   Manufacturer: [place]   Lot: [id number]   [place]: [id number]

## 2019-08-27 ENCOUNTER — Ambulatory Visit: Payer: Medicare Other

## 2019-08-28 ENCOUNTER — Ambulatory Visit: Payer: Medicare Other

## 2019-09-10 ENCOUNTER — Ambulatory Visit: Payer: Medicare PPO | Attending: Internal Medicine

## 2019-09-10 DIAGNOSIS — Z23 Encounter for immunization: Secondary | ICD-10-CM

## 2019-09-10 NOTE — Progress Notes (Signed)
   Covid-19 Vaccination Clinic  Name:  Derrick Donaldson    MRN: [id number] DOB: 09/07/1950  09/10/2019  [name] was observed post Covid-19 immunization for 15 minutes without incident. He was provided with Vaccine Information Sheet and instruction to access the V-Safe system.   [name] was instructed to call 911 with any severe reactions post vaccine: [name] Difficulty breathing  . Swelling of face and throat  . A fast heartbeat  . A bad rash all over body  . Dizziness and weakness   Immunizations Administered    Name Date Dose VIS Date Route   Pfizer COVID-19 Vaccine 09/10/2019 10:51 AM 0.3 mL 06/21/2019 Intramuscular   Manufacturer: [place]   Lot: [id number]   [place]: [id number]

## 2019-12-18 ENCOUNTER — Encounter (INDEPENDENT_AMBULATORY_CARE_PROVIDER_SITE_OTHER): Payer: Self-pay | Admitting: Otolaryngology

## 2019-12-18 ENCOUNTER — Other Ambulatory Visit: Payer: Self-pay

## 2019-12-18 ENCOUNTER — Ambulatory Visit (INDEPENDENT_AMBULATORY_CARE_PROVIDER_SITE_OTHER): Payer: Medicare PPO | Admitting: Otolaryngology

## 2019-12-18 VITALS — Temp 97.0°F

## 2019-12-18 DIAGNOSIS — H6123 Impacted cerumen, bilateral: Secondary | ICD-10-CM | POA: Diagnosis not present

## 2019-12-18 NOTE — Progress Notes (Signed)
HPI: Derrick Donaldson is a 69 y.o. male who presents for evaluation of wax buildup in his ears right side worse than left.  He has a hearing aid for the right ear from [employer] that he wears intermittently.  He presents with his wife today to the office to have his ears cleaned..  Past Medical History:  Diagnosis Date  . History of kidney stones    Past Surgical History:  Procedure Laterality Date  . COLONOSCOPY WITH PROPOFOL N/A 09/23/2014   Procedure: COLONOSCOPY WITH PROPOFOL;  Surgeon: [name], MD;  Location: [facility];  Service: Endoscopy;  Laterality: N/A;  . CYSTOSCOPY WITH LITHOLAPAXY N/A 09/05/2017   Procedure: CYSTOSCOPY WITH LITHOLAPAXY;  Surgeon: [name], MD;  Location: [facility];  Service: Urology;  Laterality: N/A;  . knee arthroscopic surgery    . TONSILLECTOMY    . TRANSURETHRAL RESECTION OF PROSTATE N/A 09/05/2017   Procedure: TRANSURETHRAL RESECTION OF THE PROSTATE (TURP);  Surgeon: [name], MD;  Location: [facility];  Service: Urology;  Laterality: N/A;   Social History   Socioeconomic History  . Marital status: Married    Spouse name: Not on file  . Number of children: Not on file  . Years of education: Not on file  . Highest education level: Not on file  Occupational History  . Not on file  Tobacco Use  . Smoking status: Never Smoker  . Smokeless tobacco: Never Used  Substance and Sexual Activity  . Alcohol use: Yes    Comment: wine nightly  . Drug use: No  . Sexual activity: Not on file  Other Topics Concern  . Not on file  Social History Narrative  . Not on file   Social Determinants of Health   Financial Resource Strain:   . Difficulty of Paying Living Expenses:   Food Insecurity:   . Worried About Charity fundraiser in the Last Year:   . Arboriculturist in the Last Year:   Transportation Needs:   . Film/video editor (Medical):   [name] Lack of Transportation (Non-Medical):   Physical Activity:   . Days of Exercise per Week:   .  Minutes of Exercise per Session:   Stress:   . Feeling of Stress :   Social Connections:   . Frequency of Communication with Friends and Family:   . Frequency of Social Gatherings with Friends and Family:   . Attends Religious Services:   . Active Member of Clubs or Organizations:   . Attends Archivist Meetings:   [name] Marital Status:    No family history on file. No Known Allergies Prior to Admission medications   Medication Sig Start Date End Date Taking? Authorizing Provider  acetaminophen (TYLENOL) 325 MG tablet Take 325 mg by mouth every 6 (six) hours as needed (for pain.).   Yes [provider]  fluticasone (FLONASE) 50 MCG/ACT nasal spray Place 1 spray into the nose daily as needed for allergies.   Yes [provider]  gabapentin (NEURONTIN) 300 MG capsule Take 300 mg by mouth daily as needed (for pain).    Yes [provider]  loratadine (CLARITIN) 10 MG tablet Take 10 mg by mouth daily.   Yes [provider]  pseudoephedrine (SUDAFED) 30 MG tablet Take 30 mg by mouth every 4 (four) hours as needed for congestion.   Yes [provider]  terbinafine (LAMISIL) 250 MG tablet Take 1 tablet (250 mg total) by mouth daily. 09/01/17  Yes [name], DPM     Positive ROS: Otherwise negative  All other systems have been reviewed and were otherwise negative with the exception of those mentioned in the HPI and as above.  Physical Exam: Constitutional: Alert, well-appearing, no acute distress Ears: External ears without lesions or tenderness. Ear canals large amount of wax embedded down the right ear small amount of wax in the left ear.  This was cleaned with forceps and curettes.  TMs were clear bilaterally.. Nasal: External nose without lesions. Clear nasal passages Oral: Oropharynx clear. Neck: No palpable adenopathy or masses Respiratory: Breathing comfortably  Skin: No facial/neck lesions or rash noted.  Cerumen  impaction removal  Date/Time: 12/18/2019 1:57 PM Performed by: [name], MD Authorized by: [name], MD   Consent:    Consent obtained:  Verbal   Consent given by:  Patient   Risks discussed:  Pain and bleeding Procedure details:    Location:  L ear and R ear   Procedure type: curette, suction and forceps   Post-procedure details:    Inspection:  TM intact and canal normal   Hearing quality:  Improved   Patient tolerance of procedure:  Tolerated well, no immediate complications Comments:     TMs are clear bilaterally.    Assessment: Bilateral cerumen impactions.  Right ear hearing loss.  Plan: Ears were cleaned in the office.  He will follow-up as needed.  [name], MD

## 2020-04-14 ENCOUNTER — Ambulatory Visit: Payer: Medicare PPO | Attending: Internal Medicine

## 2020-04-14 DIAGNOSIS — Z23 Encounter for immunization: Secondary | ICD-10-CM

## 2020-04-14 NOTE — Progress Notes (Signed)
   Covid-19 Vaccination Clinic  Name:  Derrick Donaldson    MRN: [id number] DOB: 1951/07/11  04/14/2020  [name] was observed post Covid-19 immunization for 15 minutes without incident. He was provided with Vaccine Information Sheet and instruction to access the V-Safe system.   [name] was instructed to call 911 with any severe reactions post vaccine: [name] Difficulty breathing  . Swelling of face and throat  . A fast heartbeat  . A bad rash all over body  . Dizziness and weakness

## 2020-09-10 DIAGNOSIS — D1801 Hemangioma of skin and subcutaneous tissue: Secondary | ICD-10-CM | POA: Diagnosis not present

## 2020-09-10 DIAGNOSIS — L218 Other seborrheic dermatitis: Secondary | ICD-10-CM | POA: Diagnosis not present

## 2020-09-10 DIAGNOSIS — D485 Neoplasm of uncertain behavior of skin: Secondary | ICD-10-CM | POA: Diagnosis not present

## 2020-09-10 DIAGNOSIS — L570 Actinic keratosis: Secondary | ICD-10-CM | POA: Diagnosis not present

## 2020-09-10 DIAGNOSIS — L814 Other melanin hyperpigmentation: Secondary | ICD-10-CM | POA: Diagnosis not present

## 2020-11-24 ENCOUNTER — Ambulatory Visit: Payer: Medicare PPO

## 2020-12-02 ENCOUNTER — Ambulatory Visit: Payer: Medicare PPO

## 2020-12-22 ENCOUNTER — Ambulatory Visit (INDEPENDENT_AMBULATORY_CARE_PROVIDER_SITE_OTHER): Payer: Medicare PPO | Admitting: Otolaryngology

## 2020-12-22 ENCOUNTER — Other Ambulatory Visit: Payer: Self-pay

## 2020-12-22 DIAGNOSIS — H90A31 Mixed conductive and sensorineural hearing loss, unilateral, right ear with restricted hearing on the contralateral side: Secondary | ICD-10-CM | POA: Diagnosis not present

## 2020-12-22 DIAGNOSIS — H6123 Impacted cerumen, bilateral: Secondary | ICD-10-CM | POA: Diagnosis not present

## 2020-12-22 DIAGNOSIS — H90A22 Sensorineural hearing loss, unilateral, left ear, with restricted hearing on the contralateral side: Secondary | ICD-10-CM | POA: Diagnosis not present

## 2020-12-22 NOTE — Progress Notes (Signed)
HPI: Derrick Donaldson is a 70 y.o. male who presents is referred by Derrick Donaldson audiology for evaluation of hearing loss-demonstrated more hearing loss in the right ear with conductive component more so on the right side.  I reviewed the audiology report and this demonstrated the bone scores to be approximately 20-30 dB on the right and 20 dB on the left.  He had an additional conductive component on the right side of approximately 20 dB and on the left side of 5-10 dB.  Hearing in the right ear appeared to be about 40 dB and on the left side about 30 dB.Derrick Donaldson He has several brothers and sisters and has no family history of hearing loss  Past Medical History:  Diagnosis Date   History of kidney stones    Past Surgical History:  Procedure Laterality Date   COLONOSCOPY WITH PROPOFOL N/A 09/23/2014   Procedure: COLONOSCOPY WITH PROPOFOL;  Surgeon: [name], MD;  Location: [facility];  Service: Endoscopy;  Laterality: N/A;   CYSTOSCOPY WITH LITHOLAPAXY N/A 09/05/2017   Procedure: CYSTOSCOPY WITH LITHOLAPAXY;  Surgeon: [name], MD;  Location: [facility];  Service: Urology;  Laterality: N/A;   knee arthroscopic surgery     TONSILLECTOMY     TRANSURETHRAL RESECTION OF PROSTATE N/A 09/05/2017   Procedure: TRANSURETHRAL RESECTION OF THE PROSTATE (TURP);  Surgeon: [name], MD;  Location: [facility];  Service: Urology;  Laterality: N/A;   Social History   Socioeconomic History   Marital status: Married    Spouse name: Not on file   Number of children: Not on file   Years of education: Not on file   Highest education level: Not on file  Occupational History   Not on file  Tobacco Use   Smoking status: Never   Smokeless tobacco: Never  Vaping Use   Vaping Use: Never used  Substance and Sexual Activity   Alcohol use: Yes    Comment: wine nightly   Drug use: No   Sexual activity: Not on file  Other Topics Concern   Not on file  Social History Narrative   Not on file   Social Determinants  of Health   Financial Resource Strain: Not on file  Food Insecurity: Not on file  Transportation Needs: Not on file  Physical Activity: Not on file  Stress: Not on file  Social Connections: Not on file   No family history on file. No Known Allergies Prior to Admission medications   Medication Sig Start Date End Date Taking? Authorizing Provider  acetaminophen (TYLENOL) 325 MG tablet Take 325 mg by mouth every 6 (six) hours as needed (for pain.).    [provider]  fluticasone (FLONASE) 50 MCG/ACT nasal spray Place 1 spray into the nose daily as needed for allergies.    [provider]  gabapentin (NEURONTIN) 300 MG capsule Take 300 mg by mouth daily as needed (for pain).     [provider]  loratadine (CLARITIN) 10 MG tablet Take 10 mg by mouth daily.    [provider]  pseudoephedrine (SUDAFED) 30 MG tablet Take 30 mg by mouth every 4 (four) hours as needed for congestion.    [provider]  terbinafine (LAMISIL) 250 MG tablet Take 1 tablet (250 mg total) by mouth daily. 09/01/17   [name], DPM     Positive ROS: Otherwise negative  All other systems have been reviewed and were otherwise negative with the exception of those mentioned in the HPI and  as above.  Physical Exam: Constitutional: Alert, well-appearing, no acute distress Ears: External ears without lesions or tenderness. Ear canals have moderate wax buildup in both ear canals that was cleaned with curette and forceps.  The TMs were clear bilaterally with good mobility on pneumatic otoscopy.  On hearing screening with the 512 and 1024 tuning fork AC was greater than BC bilaterally with a 512 tuning fork.  But subjectively he seemed to hear little bit better in the left ear compared to the right.  Weber really did not lateralized on one side. Nasal: External nose without lesions. Septum with minimal deformity.. Clear nasal passages Oral: Lips and gums without lesions.  Tongue and palate mucosa without lesions. Posterior oropharynx clear. Neck: No palpable adenopathy or masses Respiratory: Breathing comfortably  Skin: No facial/neck lesions or rash noted.  Cerumen impaction removal  Date/Time: 12/22/2020 6:50 PM Performed by: [name], MD Authorized by: [name], MD   Consent:    Consent obtained:  Verbal   Consent given by:  Patient Procedure details:    Location:  L ear and R ear   Procedure type: curette and forceps   Post-procedure details:    Inspection:  TM intact Comments:     Ear canals were cleaned with curette and forceps.  He has slightly more wax on the right side compared to the left.  TMs were clear bilaterally.  Assessment: Bilateral sensorineural hearing loss with conductive loss on the right side.  Clear TMs bilaterally.  He may perhaps have some otosclerosis but this is minimal.  Plan: He should be cleared for obtaining hearing aids in both ears or as needed per audiologist recommendations.   [name], MD   CC:

## 2021-03-23 DIAGNOSIS — Z820 Family history of epilepsy and other diseases of the nervous system: Secondary | ICD-10-CM | POA: Diagnosis not present

## 2021-03-23 DIAGNOSIS — R030 Elevated blood-pressure reading, without diagnosis of hypertension: Secondary | ICD-10-CM | POA: Diagnosis not present

## 2021-03-23 DIAGNOSIS — Z8249 Family history of ischemic heart disease and other diseases of the circulatory system: Secondary | ICD-10-CM | POA: Diagnosis not present

## 2021-03-23 DIAGNOSIS — Z6829 Body mass index (BMI) 29.0-29.9, adult: Secondary | ICD-10-CM | POA: Diagnosis not present

## 2021-03-23 DIAGNOSIS — G629 Polyneuropathy, unspecified: Secondary | ICD-10-CM | POA: Diagnosis not present

## 2021-03-23 DIAGNOSIS — N529 Male erectile dysfunction, unspecified: Secondary | ICD-10-CM | POA: Diagnosis not present

## 2021-03-23 DIAGNOSIS — E785 Hyperlipidemia, unspecified: Secondary | ICD-10-CM | POA: Diagnosis not present

## 2021-03-23 DIAGNOSIS — E663 Overweight: Secondary | ICD-10-CM | POA: Diagnosis not present

## 2021-03-23 DIAGNOSIS — G8929 Other chronic pain: Secondary | ICD-10-CM | POA: Diagnosis not present

## 2021-04-14 ENCOUNTER — Encounter (INDEPENDENT_AMBULATORY_CARE_PROVIDER_SITE_OTHER): Payer: Self-pay

## 2021-04-23 DIAGNOSIS — Z79899 Other long term (current) drug therapy: Secondary | ICD-10-CM | POA: Diagnosis not present

## 2021-04-23 DIAGNOSIS — Z0000 Encounter for general adult medical examination without abnormal findings: Secondary | ICD-10-CM | POA: Diagnosis not present

## 2021-04-23 DIAGNOSIS — G2581 Restless legs syndrome: Secondary | ICD-10-CM | POA: Diagnosis not present

## 2021-04-23 DIAGNOSIS — G629 Polyneuropathy, unspecified: Secondary | ICD-10-CM | POA: Diagnosis not present

## 2021-04-23 DIAGNOSIS — Z1389 Encounter for screening for other disorder: Secondary | ICD-10-CM | POA: Diagnosis not present

## 2021-04-23 DIAGNOSIS — E7800 Pure hypercholesterolemia, unspecified: Secondary | ICD-10-CM | POA: Diagnosis not present

## 2022-05-12 DIAGNOSIS — N521 Erectile dysfunction due to diseases classified elsewhere: Secondary | ICD-10-CM | POA: Diagnosis not present

## 2022-05-12 DIAGNOSIS — Z0000 Encounter for general adult medical examination without abnormal findings: Secondary | ICD-10-CM | POA: Diagnosis not present

## 2022-05-12 DIAGNOSIS — Z79899 Other long term (current) drug therapy: Secondary | ICD-10-CM | POA: Diagnosis not present

## 2022-05-12 DIAGNOSIS — B351 Tinea unguium: Secondary | ICD-10-CM | POA: Diagnosis not present

## 2022-05-12 DIAGNOSIS — G629 Polyneuropathy, unspecified: Secondary | ICD-10-CM | POA: Diagnosis not present

## 2022-05-12 DIAGNOSIS — Z1331 Encounter for screening for depression: Secondary | ICD-10-CM | POA: Diagnosis not present

## 2022-05-12 DIAGNOSIS — E7800 Pure hypercholesterolemia, unspecified: Secondary | ICD-10-CM | POA: Diagnosis not present

## 2022-05-12 DIAGNOSIS — G2581 Restless legs syndrome: Secondary | ICD-10-CM | POA: Diagnosis not present

## 2022-05-12 DIAGNOSIS — Z125 Encounter for screening for malignant neoplasm of prostate: Secondary | ICD-10-CM | POA: Diagnosis not present

## 2022-05-30 DIAGNOSIS — B351 Tinea unguium: Secondary | ICD-10-CM | POA: Diagnosis not present

## 2022-09-09 DIAGNOSIS — H0288A Meibomian gland dysfunction right eye, upper and lower eyelids: Secondary | ICD-10-CM | POA: Diagnosis not present

## 2022-09-09 DIAGNOSIS — H0288B Meibomian gland dysfunction left eye, upper and lower eyelids: Secondary | ICD-10-CM | POA: Diagnosis not present

## 2022-09-09 DIAGNOSIS — H2513 Age-related nuclear cataract, bilateral: Secondary | ICD-10-CM | POA: Diagnosis not present

## 2022-09-09 DIAGNOSIS — H0102A Squamous blepharitis right eye, upper and lower eyelids: Secondary | ICD-10-CM | POA: Diagnosis not present

## 2022-09-09 DIAGNOSIS — H524 Presbyopia: Secondary | ICD-10-CM | POA: Diagnosis not present

## 2022-09-09 DIAGNOSIS — H04123 Dry eye syndrome of bilateral lacrimal glands: Secondary | ICD-10-CM | POA: Diagnosis not present

## 2022-09-09 DIAGNOSIS — H0102B Squamous blepharitis left eye, upper and lower eyelids: Secondary | ICD-10-CM | POA: Diagnosis not present

## 2022-10-05 DIAGNOSIS — L600 Ingrowing nail: Secondary | ICD-10-CM | POA: Diagnosis not present

## 2022-10-05 DIAGNOSIS — B351 Tinea unguium: Secondary | ICD-10-CM | POA: Diagnosis not present

## 2022-10-06 ENCOUNTER — Ambulatory Visit: Payer: Medicare PPO | Admitting: Podiatry

## 2022-10-06 DIAGNOSIS — S91209A Unspecified open wound of unspecified toe(s) with damage to nail, initial encounter: Secondary | ICD-10-CM | POA: Diagnosis not present

## 2022-10-06 DIAGNOSIS — S91201A Unspecified open wound of right great toe with damage to nail, initial encounter: Secondary | ICD-10-CM

## 2022-10-06 MED ORDER — CEPHALEXIN 500 MG PO CAPS: 500.0000 mg | ORAL_CAPSULE | Freq: Three times a day (TID) | ORAL | 0 refills | Status: AC

## 2022-10-06 MED ORDER — CEPHALEXIN 500 MG PO CAPS: 500.0000 mg | ORAL_CAPSULE | Freq: Three times a day (TID) | ORAL | 0 refills | Status: DC

## 2022-10-06 NOTE — Patient Instructions (Addendum)

## 2022-10-08 NOTE — Progress Notes (Signed)
  Subjective:  Patient ID: Derrick Donaldson, male    DOB: 03-21-1951,  MRN: [id number]  Chief Complaint  Patient presents with   Nail Problem    right great toenail ingrown causing pain and bleeding - nail got caught on his clothing and ripped off partially    72 y.o. male presents with the above complaint. History confirmed with patient.   Objective:  Physical Exam: warm, good capillary refill, no trophic changes or ulcerative lesions, normal DP and PT pulses, normal sensory exam, and loosening of R great toenail w/ bleeding from nail bed  Assessment:   1. Traumatic avulsion of nail plate of toe, initial encounter      Plan:  Patient was evaluated and treated and all questions answered.    damaged Nail, right -Patient elects to proceed with minor surgery to remove damaged toenail today. Consent reviewed and signed by patient. -damaged nail excised. See procedure note. -Educated on post-procedure care including soaking. Written instructions provided and reviewed. -Rx for keflex sent to pharmacy.   Procedure: Excision of damaged Toenail Location: Right 1st toe nail Anesthesia: Lidocaine 1% plain; 1.5 mL and Marcaine 0.5% plain; 1.5 mL, digital block. Skin Prep: Betadine. Dressing: Silvadene; telfa; dry, sterile, compression dressing. Technique: Following skin prep, the toe was exsanguinated and a tourniquet was secured at the base of the toe. The affected nail was freed and excised. The tourniquet was then removed and sterile dressing applied. Disposition: Patient tolerated procedure well.    Return if symptoms worsen or fail to improve.

## 2022-10-13 ENCOUNTER — Ambulatory Visit: Payer: Medicare PPO | Admitting: Podiatry

## 2023-05-25 DIAGNOSIS — Z23 Encounter for immunization: Secondary | ICD-10-CM | POA: Diagnosis not present

## 2023-05-25 DIAGNOSIS — Z0000 Encounter for general adult medical examination without abnormal findings: Secondary | ICD-10-CM | POA: Diagnosis not present

## 2023-05-25 DIAGNOSIS — Z1331 Encounter for screening for depression: Secondary | ICD-10-CM | POA: Diagnosis not present

## 2023-05-25 DIAGNOSIS — Z9181 History of falling: Secondary | ICD-10-CM | POA: Diagnosis not present

## 2023-07-24 DIAGNOSIS — G629 Polyneuropathy, unspecified: Secondary | ICD-10-CM | POA: Diagnosis not present

## 2023-07-24 DIAGNOSIS — G2581 Restless legs syndrome: Secondary | ICD-10-CM | POA: Diagnosis not present

## 2023-07-24 DIAGNOSIS — Z23 Encounter for immunization: Secondary | ICD-10-CM | POA: Diagnosis not present

## 2023-07-24 DIAGNOSIS — B351 Tinea unguium: Secondary | ICD-10-CM | POA: Diagnosis not present

## 2023-07-24 DIAGNOSIS — R2689 Other abnormalities of gait and mobility: Secondary | ICD-10-CM | POA: Diagnosis not present

## 2023-07-24 DIAGNOSIS — E559 Vitamin D deficiency, unspecified: Secondary | ICD-10-CM | POA: Diagnosis not present

## 2023-07-24 DIAGNOSIS — E7800 Pure hypercholesterolemia, unspecified: Secondary | ICD-10-CM | POA: Diagnosis not present

## 2023-07-24 DIAGNOSIS — N521 Erectile dysfunction due to diseases classified elsewhere: Secondary | ICD-10-CM | POA: Diagnosis not present

## 2023-07-24 DIAGNOSIS — Z79899 Other long term (current) drug therapy: Secondary | ICD-10-CM | POA: Diagnosis not present

## 2023-09-20 DIAGNOSIS — H0102B Squamous blepharitis left eye, upper and lower eyelids: Secondary | ICD-10-CM | POA: Diagnosis not present

## 2023-09-20 DIAGNOSIS — N5201 Erectile dysfunction due to arterial insufficiency: Secondary | ICD-10-CM | POA: Diagnosis not present

## 2023-09-20 DIAGNOSIS — H2513 Age-related nuclear cataract, bilateral: Secondary | ICD-10-CM | POA: Diagnosis not present

## 2023-09-20 DIAGNOSIS — H04123 Dry eye syndrome of bilateral lacrimal glands: Secondary | ICD-10-CM | POA: Diagnosis not present

## 2023-09-20 DIAGNOSIS — H0102A Squamous blepharitis right eye, upper and lower eyelids: Secondary | ICD-10-CM | POA: Diagnosis not present

## 2024-03-06 DIAGNOSIS — F524 Premature ejaculation: Secondary | ICD-10-CM | POA: Diagnosis not present

## 2024-03-06 DIAGNOSIS — N5201 Erectile dysfunction due to arterial insufficiency: Secondary | ICD-10-CM | POA: Diagnosis not present

## 2024-05-14 DIAGNOSIS — H6123 Impacted cerumen, bilateral: Secondary | ICD-10-CM | POA: Diagnosis not present

## 2024-05-14 DIAGNOSIS — H903 Sensorineural hearing loss, bilateral: Secondary | ICD-10-CM | POA: Diagnosis not present

## 2024-05-14 DIAGNOSIS — Z974 Presence of external hearing-aid: Secondary | ICD-10-CM | POA: Diagnosis not present
